# Patient Record
Sex: Female | Born: 1985 | Race: White | Hispanic: No | Marital: Single | Smoking: Never smoker
Health system: Southern US, Community
[De-identification: ages and names within clinical notes are randomized; demographics above are authoritative.]

## PROBLEM LIST (undated history)

## (undated) DIAGNOSIS — G709 Myoneural disorder, unspecified: Secondary | ICD-10-CM

## (undated) DIAGNOSIS — F419 Anxiety disorder, unspecified: Secondary | ICD-10-CM

## (undated) DIAGNOSIS — R519 Headache, unspecified: Secondary | ICD-10-CM

## (undated) DIAGNOSIS — F32A Depression, unspecified: Secondary | ICD-10-CM

## (undated) DIAGNOSIS — F329 Major depressive disorder, single episode, unspecified: Secondary | ICD-10-CM

## (undated) DIAGNOSIS — F410 Panic disorder [episodic paroxysmal anxiety] without agoraphobia: Secondary | ICD-10-CM

## (undated) DIAGNOSIS — R87629 Unspecified abnormal cytological findings in specimens from vagina: Secondary | ICD-10-CM

## (undated) HISTORY — PX: OTHER SURGICAL HISTORY: SHX169

## (undated) HISTORY — DX: Anxiety disorder, unspecified: F41.9

## (undated) HISTORY — PX: ABDOMINAL SURGERY: SHX537

## (undated) HISTORY — PX: FACIAL FRACTURE SURGERY: SHX1570

---
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
## 2010-04-09 ENCOUNTER — Emergency Department: Payer: Self-pay | Admitting: Emergency Medicine

## 2010-04-11 ENCOUNTER — Emergency Department: Payer: Self-pay | Admitting: Unknown Physician Specialty

## 2010-05-07 ENCOUNTER — Emergency Department: Payer: Self-pay | Admitting: Internal Medicine

## 2010-08-16 ENCOUNTER — Emergency Department: Payer: Self-pay | Admitting: Emergency Medicine

## 2010-08-17 ENCOUNTER — Emergency Department: Payer: Self-pay | Admitting: *Deleted

## 2010-08-18 ENCOUNTER — Emergency Department: Payer: Self-pay | Admitting: *Deleted

## 2010-08-21 ENCOUNTER — Emergency Department: Payer: Self-pay | Admitting: Internal Medicine

## 2010-08-22 ENCOUNTER — Emergency Department: Payer: Self-pay | Admitting: Emergency Medicine

## 2010-08-25 ENCOUNTER — Emergency Department: Payer: Self-pay | Admitting: Emergency Medicine

## 2010-08-28 ENCOUNTER — Ambulatory Visit: Payer: Self-pay | Admitting: Anesthesiology

## 2010-10-22 ENCOUNTER — Emergency Department: Payer: Self-pay | Admitting: Emergency Medicine

## 2011-03-04 ENCOUNTER — Emergency Department: Payer: Self-pay | Admitting: Unknown Physician Specialty

## 2011-03-08 ENCOUNTER — Emergency Department: Payer: Self-pay | Admitting: Emergency Medicine

## 2011-03-08 LAB — URINALYSIS, COMPLETE
Bilirubin,UR: NEGATIVE
Ph: 5 (ref 4.5–8.0)
Protein: 100
RBC,UR: 135 /HPF (ref 0–5)
Specific Gravity: 1.032 (ref 1.003–1.030)
Squamous Epithelial: 6
WBC UR: 25 /HPF (ref 0–5)

## 2011-03-08 LAB — COMPREHENSIVE METABOLIC PANEL
Albumin: 3.5 g/dL (ref 3.4–5.0)
Alkaline Phosphatase: 154 U/L — ABNORMAL HIGH (ref 50–136)
Anion Gap: 16 (ref 7–16)
BUN: 15 mg/dL (ref 7–18)
Bilirubin,Total: 0.9 mg/dL (ref 0.2–1.0)
Chloride: 99 mmol/L (ref 98–107)
Co2: 23 mmol/L (ref 21–32)
Creatinine: 0.72 mg/dL (ref 0.60–1.30)
EGFR (Non-African Amer.): >60
Glucose: 107 mg/dL — ABNORMAL HIGH (ref 65–99)
Osmolality: 277 (ref 275–301)
Potassium: 3.2 mmol/L — ABNORMAL LOW (ref 3.5–5.1)
SGOT(AST): 26 U/L (ref 15–37)

## 2011-03-08 LAB — CBC
HCT: 41.5 % (ref 35.0–47.0)
MCH: 31.7 pg (ref 26.0–34.0)
MCV: 93 fL (ref 80–100)
Platelet: 208 10*3/uL (ref 150–440)
RBC: 4.48 10*6/uL (ref 3.80–5.20)
RDW: 13.6 % (ref 11.5–14.5)
WBC: 13.4 10*3/uL — ABNORMAL HIGH (ref 3.6–11.0)

## 2011-03-08 LAB — LIPASE, BLOOD: Lipase: 70 U/L — ABNORMAL LOW (ref 73–393)

## 2011-03-08 LAB — PREGNANCY, URINE: Pregnancy Test, Urine: NEGATIVE m[IU]/mL

## 2012-01-02 HISTORY — PX: BREAST ENHANCEMENT SURGERY: SHX7

## 2014-05-19 ENCOUNTER — Other Ambulatory Visit (HOSPITAL_COMMUNITY)
Admission: RE | Admit: 2014-05-19 | Discharge: 2014-05-19 | Disposition: A | Discharge status: Home / Self Care | Payer: Self-pay | Source: Ambulatory Visit | Attending: Family Medicine | Admitting: Family Medicine

## 2014-05-19 ENCOUNTER — Other Ambulatory Visit: Payer: Self-pay | Admitting: Physician Assistant

## 2014-05-19 DIAGNOSIS — Z124 Encounter for screening for malignant neoplasm of cervix: Secondary | ICD-10-CM | POA: Insufficient documentation

## 2014-05-24 LAB — CYTOLOGY - PAP

## 2015-01-02 NOTE — L&D Delivery Note (Signed)
Delivery Note  At 3:15 PM a viable female, named Debra Palmer,  was delivered via Vaginal, Spontaneous Delivery  APGAR: 9, 9; weight 6 lb 12.3 oz (3070 g).   Placenta status: spontaneously expelled, complete with 3 Vx cord  Anesthesia:  epidural Episiotomy: None Lacerations: Periurethral Suture Repair: 4-0 Monocryl Est. Blood Loss (mL): 200  Mom to postpartum.  Baby to Couplet care / Skin to Skin  Bottlefeeding. Outpatient circumcision.  Oryn Casanova A 11/03/2015, 6:56 PM

## 2015-04-14 ENCOUNTER — Emergency Department (HOSPITAL_COMMUNITY)
Admission: EM | Admit: 2015-04-14 | Discharge: 2015-04-14 | Disposition: A | Discharge status: Home / Self Care | Payer: Medicaid Other | Attending: Emergency Medicine | Admitting: Emergency Medicine

## 2015-04-14 ENCOUNTER — Encounter (HOSPITAL_COMMUNITY): Payer: Self-pay | Admitting: *Deleted

## 2015-04-14 DIAGNOSIS — R451 Restlessness and agitation: Secondary | ICD-10-CM | POA: Diagnosis not present

## 2015-04-14 DIAGNOSIS — F410 Panic disorder [episodic paroxysmal anxiety] without agoraphobia: Secondary | ICD-10-CM | POA: Insufficient documentation

## 2015-04-14 DIAGNOSIS — F439 Reaction to severe stress, unspecified: Secondary | ICD-10-CM | POA: Insufficient documentation

## 2015-04-14 DIAGNOSIS — F329 Major depressive disorder, single episode, unspecified: Secondary | ICD-10-CM | POA: Diagnosis not present

## 2015-04-14 DIAGNOSIS — X788XXA Intentional self-harm by other sharp object, initial encounter: Secondary | ICD-10-CM | POA: Insufficient documentation

## 2015-04-14 DIAGNOSIS — Y998 Other external cause status: Secondary | ICD-10-CM | POA: Insufficient documentation

## 2015-04-14 DIAGNOSIS — S70311A Abrasion, right thigh, initial encounter: Secondary | ICD-10-CM | POA: Insufficient documentation

## 2015-04-14 DIAGNOSIS — Y9289 Other specified places as the place of occurrence of the external cause: Secondary | ICD-10-CM | POA: Insufficient documentation

## 2015-04-14 DIAGNOSIS — F32A Depression, unspecified: Secondary | ICD-10-CM

## 2015-04-14 DIAGNOSIS — Z046 Encounter for general psychiatric examination, requested by authority: Secondary | ICD-10-CM | POA: Diagnosis present

## 2015-04-14 DIAGNOSIS — Y9389 Activity, other specified: Secondary | ICD-10-CM | POA: Insufficient documentation

## 2015-04-14 HISTORY — DX: Panic disorder (episodic paroxysmal anxiety): F41.0

## 2015-04-14 NOTE — ED Notes (Signed)
Pt brought in by police voluntarily Pt states she has been scratching and and cutting herself today due to anxiety. Pt states cutting and scratching relieves her anxiety. Pt carved the word "hell" on her hip this morning. Pt denies suicidal thoughts and states she is not trying to kill herself. Pt states she has cut or scratched herself since she was a teenager. Pt states she not currently taking medication for anxiety/depression.
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
## 2015-04-14 NOTE — Discharge Instructions (Signed)
Please follow up with resources provided. Call Crisis line if needed.   Substance Abuse Treatment Programs  Intensive Outpatient Programs [facility]     [address]      [place], [place]                   [phone]       Debra Ringer [facility] [address] [place], [place] [phone]  [facility]     (Inpatient and outpatient)     [address]           [phone]    [facility] [phone] (Suboxone and Methadone)  [address]      [place], [place] [address]      [phone]       [address] [place], [place] [phone]  [facility]bra [facility] (Outpatient/Inpatient, Chemical)    (insurance only) [phone]             [facility] (Groups & Residential) [place], [place] [phone]     [facility]     [address]     [place], [place]      [phone]       [facility] (for caregivers and family) [phone] [address] [place], [place] [phone]      Residential Treatment Programs [facility]      [address], [place], [place] [address]  [phone]       Debra Palmer [address] [place], [place] [address] [phone]  Debra Palmer        [phone]       [facility] [phone]  [facility] ([facility]             [address]                                         [place], [place]                                                [phone] or [phone]                               Debra Palmer [address] [place], [address] [phone]  [facility]    [address]      [place], [place]     [phone]       [facility]ra [facility] [address] [place], [place] [phone]  [facility]   [address]     [place], [place] [address]     [phone]      Admissions: 8am-3pm  M-F  [facility] ([facility]) [address] [place], [place] [phone]  [facility] Program: Residential Program [phone](90 Days)   [place], [place]      [phone] or [phone]     [facility]C: [facility] [place], [place] (Walk in Hours over Debra weekend or by referral)  [facility] [address] [place], [place], [place] [address] [phone]  Crisis Mobile: [facility]:  [phone] (for crisis response 24 hours a day) [facility] Hotline:  [phone] Outpatient Psychiatry and Counseling  [facility]: Mobile Crisis Management 24 hours:  [phone]  [facility] Debra [employer] sliding scale fee and walk in schedule: M-F 8am-12pm/1pm-3pm [address]  [place], [place] [address] [phone]  [facility] [address] [place], [place] [address] [phone]  [facility] (Formerly known as Debra [employer])- new patient walk-in appointments available Monday - Friday 8am -3pm.          [address] [place], [place] [address] [phone] or crisis line- [phone]  [facility]/ Intensive Outpatient Therapy Program [address] [place], [place] [address] [phone]  [facility]                  Crisis Services      [phone] [address]     [place], [place] [address]                 [facility]   [facility] [phone]. [address] [place], [place] [address]   [employer] of Care          [address]  [place], [place] [address]       [phone]  [facility] [address] [place], [place] [address] [phone]  [facility]    [address]    [place], [place] [address]     [phone]       Andee Poles, MD     [address]     [place]  [address]     [phone]       [facility] [address] [place] [address]  [facility]     [address] [place], [place]      [phone]       [facility] Eulogio Ditch, MD [address] [place], [place] [address] [phone]  [facility]     [address]     [place], [place] [address]     [phone]       [facility] [address] [place], [place] [address] [phone] Resources for Temporary Residential Assistance/Crisis Centers  DAY CENTERS [facility]) M-F 8am-3pm   [address] [place], [place] [address]   [phone] Services include: laundry, barbering, support groups, case management, phone  & computer access, showers, AA/NA mtgs, mental health/substance abuse nurse, job skills class, disability information, VA assistance, spiritual classes, etc.   HOMELESS SHELTERS  [facility]     [facility]   [address], [place]     [phone]              [employer] (women and children)       [address] [place], [place] [address] [phone] [email or web address] for application and process Application Required  [facility] [employer] [facility]   [address]    [place] [address]     [phone]                    [facility] [address] [place], [place] [address] [phone] application appt.) Application Required  [employer] (women only)    [address]     [place], [place]  [place]      Intake starts 6pm daily Need valid ID, SSC, & Police report [employer] [address] [place], [place] [phone] Application Required  [employer] (men only)     [employer]      [place], [place]       [place] (Pregnant  women only) [address] [place], [place]  Debra [facility]      [address]      [place], [place] [address]     [phone]             [facility] [address] [place], [place] 90 day commitment/SA/Application process  [facility](men only)     [address]     [place], [place]       Check-in at [facility] [address] [place], [place] [address] [phone] Men/Women/Women and Children must be there by 7 pm  [place], [place]

## 2015-05-19 LAB — OB RESULTS CONSOLE HEPATITIS B SURFACE ANTIGEN: HEP B S AG: NEGATIVE

## 2015-05-19 LAB — OB RESULTS CONSOLE RPR
RPR: NONREACTIVE
RPR: NONREACTIVE

## 2015-05-19 LAB — OB RESULTS CONSOLE HIV ANTIBODY (ROUTINE TESTING): HIV: NONREACTIVE

## 2015-05-19 LAB — OB RESULTS CONSOLE GC/CHLAMYDIA
Chlamydia: NEGATIVE
Gonorrhea: NEGATIVE

## 2015-05-19 LAB — OB RESULTS CONSOLE RUBELLA ANTIBODY, IGM: RUBELLA: IMMUNE

## 2015-08-10 ENCOUNTER — Emergency Department (HOSPITAL_COMMUNITY)
Admission: EM | Admit: 2015-08-10 | Discharge: 2015-08-10 | Disposition: A | Discharge status: Home / Self Care | Payer: Medicaid Other | Attending: Emergency Medicine | Admitting: Emergency Medicine

## 2015-08-10 ENCOUNTER — Encounter (HOSPITAL_COMMUNITY): Payer: Self-pay | Admitting: Emergency Medicine

## 2015-08-10 DIAGNOSIS — Z3A26 26 weeks gestation of pregnancy: Secondary | ICD-10-CM | POA: Insufficient documentation

## 2015-08-10 DIAGNOSIS — O26892 Other specified pregnancy related conditions, second trimester: Secondary | ICD-10-CM | POA: Insufficient documentation

## 2015-08-10 DIAGNOSIS — R1084 Generalized abdominal pain: Secondary | ICD-10-CM | POA: Diagnosis not present

## 2015-08-10 LAB — COMPREHENSIVE METABOLIC PANEL
ALBUMIN: 2.9 g/dL — AB (ref 3.5–5.0)
ALT: 17 U/L (ref 14–54)
AST: 18 U/L (ref 15–41)
Alkaline Phosphatase: 69 U/L (ref 38–126)
Anion gap: 7 (ref 5–15)
BUN: 8 mg/dL (ref 6–20)
CHLORIDE: 104 mmol/L (ref 101–111)
CO2: 24 mmol/L (ref 22–32)
Calcium: 9.2 mg/dL (ref 8.9–10.3)
Creatinine, Ser: 0.66 mg/dL (ref 0.44–1.00)
GFR calc Af Amer: >60 mL/min (ref >60)
GFR calc non Af Amer: >60 mL/min (ref >60)
GLUCOSE: 91 mg/dL (ref 65–99)
POTASSIUM: 3.6 mmol/L (ref 3.5–5.1)
Sodium: 135 mmol/L (ref 135–145)
Total Bilirubin: 0.5 mg/dL (ref 0.3–1.2)
Total Protein: 6.6 g/dL (ref 6.5–8.1)

## 2015-08-10 LAB — CBC
HEMATOCRIT: 39 % (ref 36.0–46.0)
Hemoglobin: 13 g/dL (ref 12.0–15.0)
MCH: 31.5 pg (ref 26.0–34.0)
MCHC: 33.3 g/dL (ref 30.0–36.0)
MCV: 94.4 fL (ref 78.0–100.0)
Platelets: 205 10*3/uL (ref 150–400)
RBC: 4.13 MIL/uL (ref 3.87–5.11)
RDW: 13.1 % (ref 11.5–15.5)
WBC: 10.1 10*3/uL (ref 4.0–10.5)

## 2015-08-10 LAB — URINALYSIS, ROUTINE W REFLEX MICROSCOPIC
BILIRUBIN URINE: NEGATIVE
GLUCOSE, UA: NEGATIVE mg/dL
HGB URINE DIPSTICK: NEGATIVE
KETONES UR: NEGATIVE mg/dL
Nitrite: NEGATIVE
PH: 6.5 (ref 5.0–8.0)
Protein, ur: NEGATIVE mg/dL
Specific Gravity, Urine: 1.024 (ref 1.005–1.030)

## 2015-08-10 LAB — LIPASE, BLOOD: LIPASE: 21 U/L (ref 11–51)

## 2015-08-10 LAB — URINE MICROSCOPIC-ADD ON

## 2015-08-10 MED ORDER — SODIUM CHLORIDE 0.9 % IV BOLUS (SEPSIS)
1000.0000 mL | Freq: Once | INTRAVENOUS | Status: AC
  Administered 2015-08-10: 1000 mL via INTRAVENOUS

## 2015-08-10 NOTE — ED Triage Notes (Signed)
Pt. reports intermittent generalized abdominal cramping with mild nausea onset last night , she is [redacted] weeks pregnant G2P1 , denies vaginal bleeding or discharge . No fever or chills. Notified rapid response OB nurse and requested to call her back /notify her when pt. has a room .

## 2015-08-10 NOTE — Discharge Instructions (Signed)
You have been seen today for abdominal pain. Your lab tests and OB assessment showed no unexpected abnormalities. Follow up with OB/GYN as soon as possible. Return to ED as needed.

## 2015-08-10 NOTE — ED Provider Notes (Signed)
[facility]EM[facility] DEPT Provider Note   CSN: [id number] Arrival date & time: 08/10/15  2003  First Provider Contact:  First MD Initiated Contact with Patient 08/10/15 2135        History   Chief Complaint Chief Complaint  Patient presents with  . Abdominal Pain    HPI Debra Palmer is a 30 y.o. female.  HPI   Debra Palmer is a 30 y.o. female, with a history of panic attacks, presenting to the ED with generalized abdominal pain that began gradually last night. Pain is constant, equally across the abdomen, cramping, moderate to severe, nonradiating. Pain was there for a few hours last night, and subsided before she went to sleep. Pt then had pain again today at around 4:30 pm, which lasted for about three hours, then subsided. Pt is currently pain-free and has been during her time in the ED. Endorses some minor constipation, but had a normal BM today.  Denies fever/chills, vomiting/diarrhea, urinary complaints, abnormal vaginal discharge or bleeding, or any other complaints.  Pt is [redacted] weeks pregnant, G2P1. No expected complications.  [facility]. Has been receiving regular prenatal care. Last saw Osborn Cohongela Roberts on July 24 and everything was normal.   Past Medical History:  Diagnosis Date  . Panic attack     There are no active problems to display for this patient.   Past Surgical History:  Procedure Laterality Date  . ABDOMINAL SURGERY      OB History    Gravida Para Term Preterm AB Living   2 1           SAB TAB Ectopic Multiple Live Births                   Home Medications    Prior to Admission medications   Not on File    Family History No family history on file.  Social History Social History  Substance Use Topics  . Smoking status: Never Smoker  . Smokeless tobacco: Not on file  . Alcohol use No     Allergies   Review of patient's allergies indicates no known allergies.   Review of Systems Review of Systems    Constitutional: Negative for chills, diaphoresis and fever.  Gastrointestinal: Positive for abdominal pain (resolved) and constipation (minor, occasional). Negative for diarrhea, nausea and vomiting.  Genitourinary: Negative for dysuria, flank pain, hematuria, pelvic pain and vaginal pain.  Neurological: Negative for dizziness and light-headedness.     Physical Exam Updated Vital Signs BP 98/67   Pulse 72   Temp 98.7 F (37.1 C) (Oral)   Resp 15   Ht 5\' 2"  (1.575 m)   Wt 61.2 kg   LMP 02/19/2015   SpO2 100%   BMI 24.69 kg/m   Physical Exam  Constitutional: She appears well-developed and well-nourished. No distress.  HENT:  Head: Normocephalic and atraumatic.  Eyes: Conjunctivae are normal.  Neck: Neck supple.  Cardiovascular: Normal rate, regular rhythm, normal heart sounds and intact distal pulses.   Pulmonary/Chest: Effort normal and breath sounds normal. No respiratory distress.  Abdominal: Soft. There is no tenderness. There is no guarding.  Musculoskeletal: She exhibits no edema or tenderness.  Lymphadenopathy:    She has no cervical adenopathy.  Neurological: She is alert.  Skin: Skin is warm and dry. She is not diaphoretic.  Psychiatric: She has a normal mood and affect. Her behavior is normal.  Nursing note and vitals reviewed.    ED Treatments / Results  Labs (  all labs ordered are listed, but only abnormal results are displayed) Labs Reviewed  COMPREHENSIVE METABOLIC PANEL - Abnormal; Notable for the following:       Result Value   Albumin 2.9 (*)    All other components within normal limits  URINALYSIS, ROUTINE W REFLEX MICROSCOPIC (NOT AT [facility]) - Abnormal; Notable for the following:    Leukocytes, UA SMALL (*)    All other components within normal limits  URINE MICROSCOPIC-ADD ON - Abnormal; Notable for the following:    Squamous Epithelial / LPF 0-5 (*)    Bacteria, UA FEW (*)    All other components within normal limits  LIPASE, BLOOD  CBC     EKG  EKG Interpretation None       Radiology No results found.  Procedures Procedures (including critical care time)  Medications Ordered in ED Medications  sodium chloride 0.9 % bolus 1,000 mL (0 mLs Intravenous Stopped 08/10/15 2306)     Initial Impression / Assessment and Plan / ED Course  I have reviewed the triage vital signs and the nursing notes.  Pertinent labs & imaging results that were available during my care of the patient were reviewed by me and considered in my medical decision making (see chart for details).  Clinical Course    Debra Palmer presents with intermittent abdominal pain beginning last night.  Findings and plan of care discussed with Sam Jacubowitz. Dr. Ethelda Chick personally evaluated and examined this patient.  Patient is nontoxic appearing, afebrile, not tachycardic, not tachypneic, not hypotensive, maintains SPO2 of 98-100% on room air, and is in no apparent distress. Patient has no signs of sepsis or other serious or life-threatening condition. Rapid OB RN, Olegario Messier, came to assess the patient. Found the patient to be having intermittent contractions. Olegario Messier consulted with Dr. Richardson Dopp, OB/GYN, who advised that if the UA is clear, patient should get a liter of fluids and then be reassessed. Patient did not have any contractions for over half an hour, once fluids were started. Patient continued to be completely free. OB nurse reassessed patient and cleared patient for discharge. Patient to follow up with her OB/GYN this week. Patient voices understanding of these instructions, accepts the plan, and is comfortable with discharge.  Vitals:   08/10/15 2008 08/10/15 2100 08/10/15 2115 08/10/15 2130  BP: 102/62 97/64 100/65 98/67  Pulse: 91 74 78 72  Resp: Temp: 98.7 F (37.1 C)     TempSrc: Oral     SpO2: 98% 99% 98% 100%  Weight: 61.2 kg     Height:  (1.575 m)      Vitals:   08/10/15 2130 08/10/15 2200 08/10/15 2215  08/10/15 2245  BP: (!) 86/61  Pulse: 72 79 76 75  Resp: Temp:      TempSrc:      SpO2: 100% 99% 100% 100%  Weight:      Height:         Final Clinical Impressions(s) / ED Diagnoses   Final diagnoses:  Generalized abdominal pain    New Prescriptions There are no discharge medications for this patient.    Anselm Pancoast, PA-C 08/10/15 2344    Doug Sou, MD 08/11/15 [phone]

## 2015-08-10 NOTE — ED Provider Notes (Signed)
Patient with vague diffuse abdominal pain which started last night and continued on today. She is presently asymptomatic since here. She's undergone fetal monitoring and deemed to be safe for discharge to home.   Doug SouSam Damarious Holtsclaw, MD 08/10/15 [phone]

## 2015-10-23 ENCOUNTER — Inpatient Hospital Stay (HOSPITAL_COMMUNITY)
Admission: AD | Admit: 2015-10-23 | Discharge: 2015-10-23 | Disposition: A | Discharge status: Home / Self Care | Payer: Medicaid Other | Source: Ambulatory Visit | Attending: Obstetrics and Gynecology | Admitting: Obstetrics and Gynecology

## 2015-10-23 ENCOUNTER — Encounter (HOSPITAL_COMMUNITY): Payer: Self-pay

## 2015-10-23 DIAGNOSIS — Z3A37 37 weeks gestation of pregnancy: Secondary | ICD-10-CM | POA: Diagnosis not present

## 2015-10-23 DIAGNOSIS — Z3493 Encounter for supervision of normal pregnancy, unspecified, third trimester: Secondary | ICD-10-CM

## 2015-10-23 DIAGNOSIS — O26893 Other specified pregnancy related conditions, third trimester: Secondary | ICD-10-CM | POA: Diagnosis not present

## 2015-10-23 DIAGNOSIS — R102 Pelvic and perineal pain: Secondary | ICD-10-CM | POA: Insufficient documentation

## 2015-10-23 DIAGNOSIS — O9989 Other specified diseases and conditions complicating pregnancy, childbirth and the puerperium: Secondary | ICD-10-CM

## 2015-10-23 DIAGNOSIS — Z3689 Encounter for other specified antenatal screening: Secondary | ICD-10-CM

## 2015-10-23 LAB — URINALYSIS, ROUTINE W REFLEX MICROSCOPIC
BILIRUBIN URINE: NEGATIVE
Glucose, UA: NEGATIVE mg/dL
Hgb urine dipstick: NEGATIVE
Ketones, ur: NEGATIVE mg/dL
NITRITE: NEGATIVE
PROTEIN: NEGATIVE mg/dL
SPECIFIC GRAVITY, URINE: 1.025 (ref 1.005–1.030)
pH: 6 (ref 5.0–8.0)

## 2015-10-23 LAB — URINE MICROSCOPIC-ADD ON: RBC / HPF: NONE SEEN RBC/hpf (ref 0–5)

## 2015-10-23 NOTE — Discharge Instructions (Signed)
Braxton Hicks Contractions °Contractions of the uterus can occur throughout pregnancy. Contractions are not always a sign that you are in labor.  °WHAT ARE BRAXTON HICKS CONTRACTIONS?  °Contractions that occur before labor are called Braxton Hicks contractions, or false labor. Toward the end of pregnancy (32-34 weeks), these contractions can develop more often and may become more forceful. This is not true labor because these contractions do not result in opening (dilatation) and thinning of the cervix. They are sometimes difficult to tell apart from true labor because these contractions can be forceful and people have different pain tolerances. You should not feel embarrassed if you go to the hospital with false labor. Sometimes, the only way to tell if you are in true labor is for your health care provider to look for changes in the cervix. °If there are no prenatal problems or other health problems associated with the pregnancy, it is completely safe to be sent home with false labor and await the onset of true labor. °HOW CAN YOU TELL THE DIFFERENCE BETWEEN TRUE AND FALSE LABOR? °False Labor °· The contractions of false labor are usually shorter and not as hard as those of true labor.   °· The contractions are usually irregular.   °· The contractions are often felt in the front of the lower abdomen and in the groin.   °· The contractions may go away when you walk around or change positions while lying down.   °· The contractions get weaker and are shorter lasting as time goes on.   °· The contractions do not usually become progressively stronger, regular, and closer together as with true labor.   °True Labor °· Contractions in true labor last 30-70 seconds, become very regular, usually become more intense, and increase in frequency.   °· The contractions do not go away with walking.   °· The discomfort is usually felt in the top of the uterus and spreads to the lower abdomen and low back.   °· True labor can be  determined by your health care provider with an exam. This will show that the cervix is dilating and getting thinner.   °WHAT TO REMEMBER °· Keep up with your usual exercises and follow other instructions given by your health care provider.   °· Take medicines as directed by your health care provider.   °· Keep your regular prenatal appointments.   °· Eat and drink lightly if you think you are going into labor.   °· If Braxton Hicks contractions are making you uncomfortable:   °¨ Change your position from lying down or resting to walking, or from walking to resting.   °¨ Sit and rest in a tub of warm water.   °¨ Drink 2-3 glasses of water. Dehydration may cause these contractions.   °¨ Do slow and deep breathing several times an hour.   °WHEN SHOULD I SEEK IMMEDIATE MEDICAL CARE? °Seek immediate medical care if: °· Your contractions become stronger, more regular, and closer together.   °· You have fluid leaking or gushing from your vagina.   °· You have a fever.   °· You pass blood-tinged mucus.   °· You have vaginal bleeding.   °· You have continuous abdominal pain.   °· You have low back pain that you never had before.   °· You feel your baby's head pushing down and causing pelvic pressure.   °· Your baby is not moving as much as it used to.   °  °This information is not intended to replace advice given to you by your health care provider. Make sure you discuss any questions you have with your health care   provider. °  °Document Released: 12/18/2004 Document Revised: 12/23/2012 Document Reviewed: 09/29/2012 °Elsevier Interactive Patient Education ©2016 [employer] °Fetal Movement Counts °Patient Name: __________________________________________________ Patient Due Date: ____________________ °Performing a fetal movement count is highly recommended in high-risk pregnancies, but it is good for every pregnant woman to do. Your health care provider may ask you to start counting fetal movements at 28 weeks of the  pregnancy. Fetal movements often increase: °· After eating a full meal. °· After physical activity. °· After eating or drinking something sweet or cold. °· At rest. °Pay attention to when you feel the baby is most active. This will help you notice a pattern of your baby's sleep and wake cycles and what factors contribute to an increase in fetal movement. It is important to perform a fetal movement count at the same time each day when your baby is normally most active.  °HOW TO COUNT FETAL MOVEMENTS °1. Find a quiet and comfortable area to sit or lie down on your left side. Lying on your left side provides the best blood and oxygen circulation to your baby. °2. Write down the day and time on a sheet of paper or in a journal. °3. Start counting kicks, flutters, swishes, rolls, or jabs in a 2-hour period. You should feel at least 10 movements within 2 hours. °4. If you do not feel 10 movements in 2 hours, wait 2-3 hours and count again. Look for a change in the pattern or not enough counts in 2 hours. °SEEK MEDICAL CARE IF: °· You feel less than 10 counts in 2 hours, tried twice. °· There is no movement in over an hour. °· The pattern is changing or taking longer each day to reach 10 counts in 2 hours. °· You feel the baby is not moving as he or she usually does. °Date: ____________ Movements: ____________ Start time: ____________ Finish time: ____________  °Date: ____________ Movements: ____________ Start time: ____________ Finish time: ____________ °Date: ____________ Movements: ____________ Start time: ____________ Finish time: ____________ °Date: ____________ Movements: ____________ Start time: ____________ Finish time: ____________ °Date: ____________ Movements: ____________ Start time: ____________ Finish time: ____________ °Date: ____________ Movements: ____________ Start time: ____________ Finish time: ____________ °Date: ____________ Movements: ____________ Start time: ____________ Finish time:  ____________ °Date: ____________ Movements: ____________ Start time: ____________ Finish time: ____________  °Date: ____________ Movements: ____________ Start time: ____________ Finish time: ____________ °Date: ____________ Movements: ____________ Start time: ____________ Finish time: ____________ °Date: ____________ Movements: ____________ Start time: ____________ Finish time: ____________ °Date: ____________ Movements: ____________ Start time: ____________ Finish time: ____________ °Date: ____________ Movements: ____________ Start time: ____________ Finish time: ____________ °Date: ____________ Movements: ____________ Start time: ____________ Finish time: ____________ °Date: ____________ Movements: ____________ Start time: ____________ Finish time: ____________  °Date: ____________ Movements: ____________ Start time: ____________ Finish time: ____________ °Date: ____________ Movements: ____________ Start time: ____________ Finish time: ____________ °Date: ____________ Movements: ____________ Start time: ____________ Finish time: ____________ °Date: ____________ Movements: ____________ Start time: ____________ Finish time: ____________ °Date: ____________ Movements: ____________ Start time: ____________ Finish time: ____________ °Date: ____________ Movements: ____________ Start time: ____________ Finish time: ____________ °Date: ____________ Movements: ____________ Start time: ____________ Finish time: ____________  °Date: ____________ Movements: ____________ Start time: ____________ Finish time: ____________ °Date: ____________ Movements: ____________ Start time: ____________ Finish time: ____________ °Date: ____________ Movements: ____________ Start time: ____________ Finish time: ____________ °Date: ____________ Movements: ____________ Start time: ____________ Finish time: ____________ °Date: ____________ Movements: ____________ Start time: ____________ Finish time: ____________ °Date: ____________ Movements:  ____________ Start time: ____________ Finish time:   ____________ °Date: ____________ Movements: ____________ Start time: ____________ Finish time: ____________  °Date: ____________ Movements: ____________ Start time: ____________ Finish time: ____________ °Date: ____________ Movements: ____________ Start time: ____________ Finish time: ____________ °Date: ____________ Movements: ____________ Start time: ____________ Finish time: ____________ °Date: ____________ Movements: ____________ Start time: ____________ Finish time: ____________ °Date: ____________ Movements: ____________ Start time: ____________ Finish time: ____________ °Date: ____________ Movements: ____________ Start time: ____________ Finish time: ____________ °Date: ____________ Movements: ____________ Start time: ____________ Finish time: ____________  °Date: ____________ Movements: ____________ Start time: ____________ Finish time: ____________ °Date: ____________ Movements: ____________ Start time: ____________ Finish time: ____________ °Date: ____________ Movements: ____________ Start time: ____________ Finish time: ____________ °Date: ____________ Movements: ____________ Start time: ____________ Finish time: ____________ °Date: ____________ Movements: ____________ Start time: ____________ Finish time: ____________ °Date: ____________ Movements: ____________ Start time: ____________ Finish time: ____________ °Date: ____________ Movements: ____________ Start time: ____________ Finish time: ____________  °Date: ____________ Movements: ____________ Start time: ____________ Finish time: ____________ °Date: ____________ Movements: ____________ Start time: ____________ Finish time: ____________ °Date: ____________ Movements: ____________ Start time: ____________ Finish time: ____________ °Date: ____________ Movements: ____________ Start time: ____________ Finish time: ____________ °Date: ____________ Movements: ____________ Start time: ____________ Finish  time: ____________ °Date: ____________ Movements: ____________ Start time: ____________ Finish time: ____________ °Date: ____________ Movements: ____________ Start time: ____________ Finish time: ____________  °Date: ____________ Movements: ____________ Start time: ____________ Finish time: ____________ °Date: ____________ Movements: ____________ Start time: ____________ Finish time: ____________ °Date: ____________ Movements: ____________ Start time: ____________ Finish time: ____________ °Date: ____________ Movements: ____________ Start time: ____________ Finish time: ____________ °Date: ____________ Movements: ____________ Start time: ____________ Finish time: ____________ °Date: ____________ Movements: ____________ Start time: ____________ Finish time: ____________ °  °This information is not intended to replace advice given to you by your health care provider. Make sure you discuss any questions you have with your health care provider. °  °Document Released: 01/17/2006 Document Revised: 01/08/2014 Document Reviewed: 10/15/2011 °Elsevier Interactive Patient Education ©2016 [employer] Inc[employer] ° °

## 2015-10-23 NOTE — MAU Provider Note (Signed)
History     CSN: [id number]  Arrival date and time: 10/23/15 4098   None     Chief Complaint  Patient presents with  . Vaginal Pain   G3P1011 @37 .3 weeks here with acute onset of severe constant vaginal pressure when she got up to BR at 0100. Pain occurs with ambulation. She reports good FM. She denies VB, LOF, and reg painful ctx. No urinary sx. No vaginal discharge or odor. No constipation, BM this am.    OB History    Gravida Para Term Preterm AB Living   3 1     1 1    SAB TAB Ectopic Multiple Live Births   1       1      Past Medical History:  Diagnosis Date  . Panic attack     Past Surgical History:  Procedure Laterality Date  . ABDOMINAL SURGERY      History reviewed. No pertinent family history.  Social History  Substance Use Topics  . Smoking status: Never Smoker  . Smokeless tobacco: Never Used  . Alcohol use No    Allergies: No Known Allergies  No prescriptions prior to admission.    Review of Systems  Constitutional: Negative.   Genitourinary: Negative.    Physical Exam   Blood pressure 101/66, pulse 75, temperature 97.8 F (36.6 C), resp. rate 18, height 5\' 2"  (1.575 m), weight 64.9 kg (143 lb 1.9 oz), last menstrual period 02/19/2015.  Physical Exam  Constitutional: She is oriented to person, place, and time. She appears well-developed and well-nourished.  HENT:  Head: Normocephalic and atraumatic.  Neck: Normal range of motion. Neck supple.  Cardiovascular: Normal rate.   Respiratory: Effort normal.  GI: Soft. She exhibits no distension. There is no tenderness.  gravid  Genitourinary:  Genitourinary Comments: SVE: closed/thick  Musculoskeletal: Normal range of motion.  Neurological: She is alert and oriented to person, place, and time.  Skin: Skin is warm and dry.  Psychiatric: She has a normal mood and affect.   EFM: 120 bpm, mod variability, + accels, no decels Toco: irritability Results for orders placed or performed  during the hospital encounter of 10/23/15 (from the past 24 hour(s))  Urinalysis, Routine w reflex microscopic (not at [facility])     Status: Abnormal   Collection Time: 10/23/15  8:40 AM  Result Value Ref Range   Color, Urine YELLOW YELLOW   APPearance HAZY (A) CLEAR   Specific Gravity, Urine 1.025 1.005 - 1.030   pH 6.0 5.0 - 8.0   Glucose, UA NEGATIVE NEGATIVE mg/dL   Hgb urine dipstick NEGATIVE NEGATIVE   Bilirubin Urine NEGATIVE NEGATIVE   Ketones, ur NEGATIVE NEGATIVE mg/dL   Protein, ur NEGATIVE NEGATIVE mg/dL   Nitrite NEGATIVE NEGATIVE   Leukocytes, UA MODERATE (A) NEGATIVE  Urine microscopic-add on     Status: Abnormal   Collection Time: 10/23/15  8:40 AM  Result Value Ref Range   Squamous Epithelial / LPF 6-30 (A) NONE SEEN   WBC, UA 6-30 0 - 5 WBC/hpf   RBC / HPF NONE SEEN 0 - 5 RBC/hpf   Bacteria, UA RARE (A) NONE SEEN   Urine-Other MUCOUS PRESENT     MAU Course  Procedures  MDM Labs ordered and reviewed. No evidence of UTI or labor. Likely fetal engagement. Discussed presentation, clinical findings, and plan with Dr. Su Hilt. Stable for discharge home.  Assessment and Plan   1. Third trimester pregnancy   2. NST (non-stress test) reactive  3. Lightening of fetus during pregnancy in third trimester    Discharge home Labor precautions [facility] Comfort measures: maternity support belt, warm baths, position changes, Tylenol Follow up in office as scheduled this week  Donette LarryMelanie Sargun Rummell, CNM 10/23/2015, 8:56 AM

## 2015-10-23 NOTE — MAU Note (Addendum)
Patient presents with onset of vaginal pain since around 0100 am today. Constant when walking, hurts to turn over in the bed, states she feels a lot of pressure.  Denies vaginal bleeding, is having vaginal discharge without an odor.

## 2015-11-03 ENCOUNTER — Inpatient Hospital Stay (HOSPITAL_COMMUNITY)
Admission: AD | Admit: 2015-11-03 | Discharge: 2015-11-04 | DRG: 775 | Disposition: A | Discharge status: Home / Self Care | Payer: Medicaid Other | Source: Ambulatory Visit | Attending: Obstetrics and Gynecology | Admitting: Obstetrics and Gynecology

## 2015-11-03 ENCOUNTER — Inpatient Hospital Stay (HOSPITAL_COMMUNITY): Payer: Medicaid Other | Admitting: Anesthesiology

## 2015-11-03 ENCOUNTER — Encounter (HOSPITAL_COMMUNITY): Payer: Self-pay | Admitting: *Deleted

## 2015-11-03 DIAGNOSIS — Z3A39 39 weeks gestation of pregnancy: Secondary | ICD-10-CM

## 2015-11-03 DIAGNOSIS — Z3483 Encounter for supervision of other normal pregnancy, third trimester: Secondary | ICD-10-CM | POA: Diagnosis present

## 2015-11-03 LAB — RPR: RPR Ser Ql: NONREACTIVE

## 2015-11-03 LAB — CBC
HCT: 44 % (ref 36.0–46.0)
Hemoglobin: 15.7 g/dL — ABNORMAL HIGH (ref 12.0–15.0)
MCH: 33 pg (ref 26.0–34.0)
MCHC: 35.7 g/dL (ref 30.0–36.0)
MCV: 92.4 fL (ref 78.0–100.0)
PLATELETS: 141 10*3/uL — AB (ref 150–400)
RBC: 4.76 MIL/uL (ref 3.87–5.11)
RDW: 13.4 % (ref 11.5–15.5)
WBC: 12.6 10*3/uL — AB (ref 4.0–10.5)

## 2015-11-03 LAB — TYPE AND SCREEN
ABO/RH(D): A POS
ANTIBODY SCREEN: NEGATIVE

## 2015-11-03 LAB — OB RESULTS CONSOLE GBS: STREP GROUP B AG: NEGATIVE

## 2015-11-03 LAB — ABO/RH: ABO/RH(D): A POS

## 2015-11-03 MED ORDER — LACTATED RINGERS IV SOLN
500.0000 mL | Freq: Once | INTRAVENOUS | Status: AC
  Administered 2015-11-03: 500 mL via INTRAVENOUS

## 2015-11-03 MED ORDER — PHENYLEPHRINE 40 MCG/ML (10ML) SYRINGE FOR IV PUSH (FOR BLOOD PRESSURE SUPPORT)
80.0000 ug | PREFILLED_SYRINGE | INTRAVENOUS | Status: DC | PRN
  Filled 2015-11-03: qty 10
  Filled 2015-11-03: qty 5

## 2015-11-03 MED ORDER — EPHEDRINE 5 MG/ML INJ
10.0000 mg | INTRAVENOUS | Status: DC | PRN
  Filled 2015-11-03: qty 4

## 2015-11-03 MED ORDER — TETANUS-DIPHTH-ACELL PERTUSSIS 5-2.5-18.5 LF-MCG/0.5 IM SUSP: 0.5000 mL | Freq: Once | INTRAMUSCULAR | Status: DC

## 2015-11-03 MED ORDER — DIPHENHYDRAMINE HCL 25 MG PO CAPS: 25.0000 mg | ORAL_CAPSULE | Freq: Four times a day (QID) | ORAL | Status: DC | PRN

## 2015-11-03 MED ORDER — ACETAMINOPHEN 325 MG PO TABS: 650.0000 mg | ORAL_TABLET | ORAL | Status: DC | PRN

## 2015-11-03 MED ORDER — SOD CITRATE-CITRIC ACID 500-334 MG/5ML PO SOLN: 30.0000 mL | ORAL | Status: DC | PRN

## 2015-11-03 MED ORDER — ONDANSETRON HCL 4 MG PO TABS: 4.0000 mg | ORAL_TABLET | ORAL | Status: DC | PRN

## 2015-11-03 MED ORDER — ACETAMINOPHEN 325 MG PO TABS
650.0000 mg | ORAL_TABLET | ORAL | Status: DC | PRN
  Administered 2015-11-04: 650 mg via ORAL
  Filled 2015-11-03: qty 2

## 2015-11-03 MED ORDER — LACTATED RINGERS IV SOLN: 500.0000 mL | INTRAVENOUS | Status: DC | PRN

## 2015-11-03 MED ORDER — ZOLPIDEM TARTRATE 5 MG PO TABS: 5.0000 mg | ORAL_TABLET | Freq: Every evening | ORAL | Status: DC | PRN

## 2015-11-03 MED ORDER — OXYTOCIN 40 UNITS IN LACTATED RINGERS INFUSION - SIMPLE MED
2.5000 [IU]/h | INTRAVENOUS | Status: DC
  Administered 2015-11-03: 2.5 [IU]/h via INTRAVENOUS
  Filled 2015-11-03: qty 1000

## 2015-11-03 MED ORDER — FENTANYL 2.5 MCG/ML BUPIVACAINE 1/10 % EPIDURAL INFUSION (WH - ANES)
14.0000 mL/h | INTRAMUSCULAR | Status: DC | PRN
  Administered 2015-11-03: 14 mL/h via EPIDURAL
  Filled 2015-11-03: qty 125

## 2015-11-03 MED ORDER — OXYTOCIN BOLUS FROM INFUSION
500.0000 mL | Freq: Once | INTRAVENOUS | Status: AC
  Administered 2015-11-03: 500 mL via INTRAVENOUS

## 2015-11-03 MED ORDER — PHENYLEPHRINE 40 MCG/ML (10ML) SYRINGE FOR IV PUSH (FOR BLOOD PRESSURE SUPPORT)
80.0000 ug | PREFILLED_SYRINGE | INTRAVENOUS | Status: DC | PRN
  Filled 2015-11-03: qty 5

## 2015-11-03 MED ORDER — DIBUCAINE 1 % RE OINT: 1.0000 "application " | TOPICAL_OINTMENT | RECTAL | Status: DC | PRN

## 2015-11-03 MED ORDER — IBUPROFEN 600 MG PO TABS
600.0000 mg | ORAL_TABLET | Freq: Four times a day (QID) | ORAL | Status: DC
  Administered 2015-11-04 (×4): 600 mg via ORAL
  Filled 2015-11-03 (×5): qty 1

## 2015-11-03 MED ORDER — ONDANSETRON HCL 4 MG/2ML IJ SOLN: 4.0000 mg | Freq: Four times a day (QID) | INTRAMUSCULAR | Status: DC | PRN

## 2015-11-03 MED ORDER — ONDANSETRON HCL 4 MG/2ML IJ SOLN: 4.0000 mg | INTRAMUSCULAR | Status: DC | PRN

## 2015-11-03 MED ORDER — PRENATAL MULTIVITAMIN CH
1.0000 | ORAL_TABLET | Freq: Every day | ORAL | Status: DC
  Administered 2015-11-04: 1 via ORAL
  Filled 2015-11-03: qty 1

## 2015-11-03 MED ORDER — FERROUS SULFATE 325 (65 FE) MG PO TABS
325.0000 mg | ORAL_TABLET | Freq: Two times a day (BID) | ORAL | Status: DC
  Administered 2015-11-04 (×2): 325 mg via ORAL
  Filled 2015-11-03 (×2): qty 1

## 2015-11-03 MED ORDER — MEASLES, MUMPS & RUBELLA VAC ~~LOC~~ INJ: 0.5000 mL | INJECTION | Freq: Once | SUBCUTANEOUS | Status: DC

## 2015-11-03 MED ORDER — LACTATED RINGERS IV SOLN
INTRAVENOUS | Status: DC
  Administered 2015-11-03 (×2): via INTRAVENOUS

## 2015-11-03 MED ORDER — BENZOCAINE-MENTHOL 20-0.5 % EX AERO
1.0000 "application " | INHALATION_SPRAY | CUTANEOUS | Status: DC | PRN
  Administered 2015-11-03: 1 via TOPICAL
  Filled 2015-11-03: qty 56

## 2015-11-03 MED ORDER — OXYCODONE-ACETAMINOPHEN 5-325 MG PO TABS: 1.0000 | ORAL_TABLET | ORAL | Status: DC | PRN

## 2015-11-03 MED ORDER — WITCH HAZEL-GLYCERIN EX PADS: 1.0000 "application " | MEDICATED_PAD | CUTANEOUS | Status: DC | PRN

## 2015-11-03 MED ORDER — DIPHENHYDRAMINE HCL 50 MG/ML IJ SOLN: 12.5000 mg | INTRAMUSCULAR | Status: DC | PRN

## 2015-11-03 MED ORDER — LIDOCAINE HCL (PF) 1 % IJ SOLN
INTRAMUSCULAR | Status: DC | PRN
  Administered 2015-11-03 (×2): 7 mL via EPIDURAL

## 2015-11-03 MED ORDER — OXYCODONE-ACETAMINOPHEN 5-325 MG PO TABS: 2.0000 | ORAL_TABLET | ORAL | Status: DC | PRN

## 2015-11-03 MED ORDER — SIMETHICONE 80 MG PO CHEW: 80.0000 mg | CHEWABLE_TABLET | ORAL | Status: DC | PRN

## 2015-11-03 MED ORDER — FLEET ENEMA 7-19 GM/118ML RE ENEM: 1.0000 | ENEMA | RECTAL | Status: DC | PRN

## 2015-11-03 MED ORDER — LIDOCAINE HCL (PF) 1 % IJ SOLN
30.0000 mL | INTRAMUSCULAR | Status: DC | PRN
  Filled 2015-11-03: qty 30

## 2015-11-03 MED ORDER — SENNOSIDES-DOCUSATE SODIUM 8.6-50 MG PO TABS
2.0000 | ORAL_TABLET | ORAL | Status: DC
  Administered 2015-11-04 (×2): 2 via ORAL
  Filled 2015-11-03 (×3): qty 2

## 2015-11-03 MED ORDER — COCONUT OIL OIL: 1.0000 "application " | TOPICAL_OIL | Status: DC | PRN

## 2015-11-03 NOTE — Anesthesia Postprocedure Evaluation (Signed)
Anesthesia Post Note  Patient: Debra Palmer  Procedure(s) Performed: * No procedures listed *  Patient location during evaluation: Mother Baby Anesthesia Type: Epidural Level of consciousness: awake and alert Pain management: pain level controlled Vital Signs Assessment: post-procedure vital signs reviewed and stable Respiratory status: spontaneous breathing, nonlabored ventilation and respiratory function stable Cardiovascular status: stable Postop Assessment: no headache, no backache and epidural receding Anesthetic complications: no     Last Vitals:  Vitals:   11/03/15 1658 11/03/15 1800  BP: 103/63 (!) 92/58  Pulse: 94 85  Resp: 18 18  Temp: 36.8 C 37.2 C    Last Pain:  Vitals:   11/03/15 1800  TempSrc: Oral  PainSc: 0-No pain   Pain Goal: Patients Stated Pain Goal: 0 (11/03/15 0926)               Junious SilkGILBERT,Coltyn Hanning

## 2015-11-03 NOTE — MAU Note (Signed)
C/o  ucs since 0345 this Am; denies any leaking or bleeding;

## 2015-11-03 NOTE — MAU Note (Signed)
Pt C/O uc's since 0345, denies bleeding or LOF.

## 2015-11-03 NOTE — H&P (Signed)
  Debra Palmer is a 30 y.o. female , G2P1, presenting for regular uterine contractions. Denies bleeding or LOF. Reports good FM  Pregnancy followed at [facility] since 14  weeks and remarkable for:  1. Anterior placenta previa at 20 weeks ultrasound, resolved at 24 weeks 2. Generalized pruritus in 3rd trimester with normal bile acids and CMP on 10/11/15 3. BPD growth lag at 24 weeks with normal growth ultrasound at 36+6 weeks: EFW 6 lbs 3 oz and normal AFI  OB History    Gravida Para Term Preterm AB Living   3 1 1   1 1    SAB TAB Ectopic Multiple Live Births   1       1     Past Medical History:  Diagnosis Date  . Panic attack    Past Surgical History:  Procedure Laterality Date  . ABDOMINAL SURGERY      Family History:   family history is not contributory Social History:    reports that she has never smoked. She has never used smokeless tobacco. She reports that she does not drink alcohol or use drugs.   Prenatal labs: ABO, Rh:  A+ Antibody:  negative Rubella: immune RPR:   non-reactive HBsAg:   negative HIV:   negative GBS:  negative    Prenatal Transfer Tool  Maternal Diabetes: No Genetic Screening: Normal Quad screen Maternal Ultrasounds/Referrals: Normal other than resolved placenta previa Fetal Ultrasounds or other Referrals:  None Maternal Substance Abuse:  No Significant Maternal Medications:  None Significant Maternal Lab Results: None   Dilation: 4 Effacement (%): 80 Station: -2 Exam by:: Debra ColaDee Carter RN Blood pressure 97/78, pulse (!) 126, temperature 98.4 F (36.9 C), temperature source Oral, resp. rate 20, height 5\' 3"  (1.6 m), weight 146 lb (66.2 kg), last menstrual period 02/19/2015.  General Appearance: Alert, appropriate appearance for age. No acute distress HEENT Exam: Grossly normal Chest/Respiratory Exam: Normal chest wall and respirations. Clear to auscultation  Cardiovascular Exam: Regular rate and rhythm. S1, S2, no  murmur Gastrointestinal Exam: soft, non-tender, Uterus gravid with size compatible with GA, Vertex presentation by Leopold's maneuvers Psychiatric Exam: Alert and oriented, appropriate affect  ++++++++++++++++++++++++++++++++++++++++++++++++++++++++++++++++  Vaginal exam: 6/80/-1 AROM with clear fluid and bloody show at upper limit of normal  Fetal tracings: Category 1  ++++++++++++++++++++++++++++++++++++++++++++++++++++++++++++++++   Assessment/Plan:  39 week IUP in active labor with reassuring fetal tracings Will monitor bleeding closely SVD expected Patient, husband and family updated. Questions answered   Debra LaySandra Damariz Paganelli MD 11/03/2015, 9:58 AM

## 2015-11-03 NOTE — Anesthesia Preprocedure Evaluation (Signed)
Anesthesia Evaluation  Patient identified by MRN, date of birth, ID band Patient awake    Reviewed: Allergy & Precautions, H&P , Patient's Chart, lab work & pertinent test results  Airway Mallampati: I  TM Distance: >3 FB Neck ROM: full    Dental no notable dental hx.    Pulmonary neg pulmonary ROS,    Pulmonary exam normal        Cardiovascular negative cardio ROS Normal cardiovascular exam     Neuro/Psych negative neurological ROS  negative psych ROS   GI/Hepatic negative GI ROS, Neg liver ROS,   Endo/Other  negative endocrine ROS  Renal/GU negative Renal ROS     Musculoskeletal   Abdominal Normal abdominal exam  (+)   Peds  Hematology negative hematology ROS (+)   Anesthesia Other Findings   Reproductive/Obstetrics (+) Pregnancy                             Anesthesia Physical Anesthesia Plan  ASA: II  Anesthesia Plan: Epidural   Post-op Pain Management:    Induction:   Airway Management Planned:   Additional Equipment:   Intra-op Plan:   Post-operative Plan:   Informed Consent: I have reviewed the patients History and Physical, chart, labs and discussed the procedure including the risks, benefits and alternatives for the proposed anesthesia with the patient or authorized representative who has indicated his/her understanding and acceptance.     Plan Discussed with:   Anesthesia Plan Comments:         Anesthesia Quick Evaluation  

## 2015-11-03 NOTE — Anesthesia Procedure Notes (Signed)
Epidural Patient location during procedure: OB Start time: 11/03/2015 9:47 AM End time: 11/03/2015 9:51 AM  Staffing Anesthesiologist: Leilani AbleHATCHETT, Viaan Knippenberg Performed: anesthesiologist   Preanesthetic Checklist Completed: patient identified, surgical consent, pre-op evaluation, timeout performed, IV checked, risks and benefits discussed and monitors and equipment checked  Epidural Patient position: sitting Prep: site prepped and draped and DuraPrep Patient monitoring: continuous pulse ox and blood pressure Approach: midline Location: L3-L4 Injection technique: LOR air  Needle:  Needle type: Tuohy  Needle gauge: 17 G Needle length: 9 cm and 9 Needle insertion depth: 5 cm cm Catheter type: closed end flexible Catheter size: 19 Gauge Catheter at skin depth: 10 cm Test dose: negative and Other  Assessment Sensory level: T9 Events: blood not aspirated, injection not painful, no injection resistance, negative IV test and no paresthesia  Additional Notes Reason for block:procedure for pain

## 2015-11-04 LAB — CBC
HEMATOCRIT: 37.5 % (ref 36.0–46.0)
HEMOGLOBIN: 12.9 g/dL (ref 12.0–15.0)
MCH: 32 pg (ref 26.0–34.0)
MCHC: 34.4 g/dL (ref 30.0–36.0)
MCV: 93.1 fL (ref 78.0–100.0)
Platelets: 127 10*3/uL — ABNORMAL LOW (ref 150–400)
RBC: 4.03 MIL/uL (ref 3.87–5.11)
RDW: 13.4 % (ref 11.5–15.5)
WBC: 12.3 10*3/uL — ABNORMAL HIGH (ref 4.0–10.5)

## 2015-11-04 NOTE — Progress Notes (Signed)
MOB was referred for history of depression/anxiety. * Referral screened out by Clinical Social Worker because none of the following criteria appear to apply: ~ History of anxiety/depression during this pregnancy, or of post-partum depression. ~ Diagnosis of anxiety and/or depression within last 3 years OR * MOB's symptoms currently being treated with medication and/or therapy.  Per  MOB, MOB receives routinely counseling service with Arnette FeltsLylan Wingfield [address]  [place], [place] [id number] [phone] .  CSW educated MOB and FOB about PPD. CSW informed MOB of possible supports and interventions to decrease PPD.  CSW also encouraged MOB to seek medical attention if needed for increased signs and symptoms for PPD.   Please contact the Clinical Social Worker if needs arise, or if MOB requests. Blaine HamperAngel Boyd-Gilyard, MSW, LCSW Clinical Social Work [phone]

## 2015-11-04 NOTE — Progress Notes (Signed)
UR chart review completed.  

## 2015-11-04 NOTE — Discharge Summary (Signed)
OB Discharge Summary     Patient Name: Debra Palmer DOB: 03/20/1985 MRN: [id number]  Date of admission: 11/03/2015 Delivering MD: Silverio LayIVARD, SANDRA   Date of discharge: 11/04/2015  Admitting diagnosis: 39 wks; Contractions  Intrauterine pregnancy: 4317w0d     Secondary diagnosis:  Active Problems:   Normal labor   SVD (spontaneous vaginal delivery)  Additional problems: None     Discharge diagnosis: Term Pregnancy Delivered                                                                                                Post partum procedures:None  Augmentation: AROM  Complications: None  Hospital course:  Onset of Labor With Vaginal Delivery     [age] y.o. yo [id number] at 417w0d was admitted in Active Labor on 11/03/2015. Patient had an uncomplicated labor course as follows:  Membrane Rupture Time/Date: 12:01 PM ,11/03/2015   Intrapartum Procedures: Episiotomy: None [1]                                         Lacerations:  Periurethral [8]  Patient had a delivery of a Viable female infant. 11/03/2015  Information for the patient's newborn:  Blima Richllinwood, Boy Calais [[id number]][[id number]]  Delivery Method: Vaginal, Spontaneous Delivery (Filed from Delivery Summary)    Pateint had an uncomplicated postpartum course.  She is ambulating, tolerating a regular diet, passing flatus, and urinating well. Patient is discharged home in stable condition on 11/04/15.    Physical exam Vitals:   11/03/15 1658 11/03/15 1800 11/03/15 2200 11/04/15 0543  BP: 103/63 (!) 92/58 (!) 95/52 97/62  Pulse: 94 85 86 73  Resp: 18 18 18 18   Temp: 98.3 F (36.8 C) 99 F (37.2 C) 98.2 F (36.8 C) 98.5 F (36.9 C)  TempSrc: Oral Oral Oral Oral  SpO2:      Weight:      Height:       General: alert, cooperative and no distress Lochia: appropriate Uterine Fundus: firm Incision: N/A DVT Evaluation: No evidence of DVT seen on physical exam. Labs: Lab Results  Component Value Date   WBC 12.3 (H) 11/04/2015   HGB 12.9 11/04/2015   HCT 37.5 11/04/2015   MCV 93.1 11/04/2015   PLT 127 (L) 11/04/2015   CMP Latest Ref Rng & Units 08/10/2015  Glucose 65 - 99 mg/dL 91  BUN 6 - 20 mg/dL 8  Creatinine 1.300.44 - 8.651.00 mg/dL 7.840.66  Sodium 696135 - 295145 mmol/L 135  Potassium 3.5 - 5.1 mmol/L 3.6  Chloride 101 - 111 mmol/L 104  CO2 22 - 32 mmol/L 24  Calcium 8.9 - 10.3 mg/dL 9.2  Total Protein 6.5 - 8.1 g/dL 6.6  Total Bilirubin 0.3 - 1.2 mg/dL 0.5  Alkaline Phos 38 - 126 U/L 69  AST 15 - 41 U/L 18  ALT 14 - 54 U/L 17    Discharge instruction: per After Visit Summary and "Baby and Me Booklet".  After visit meds:    Medication List  You have not been prescribed any medications.     Diet: routine diet  Activity: Advance as tolerated. Pelvic rest for 6 weeks.   Outpatient follow up:6 weeks Follow up Appt:No future appointments. Follow up Visit:No Follow-up on file.  Postpartum contraception: IUD Mirena  Newborn Data: Live born female  Birth Weight: 6 lb 12.3 oz (3070 g) APGAR: 9, 9  Baby Feeding: Bottle Disposition:home with mother  Outpatient circ.   11/04/2015 Kenney HousemanNancy Jean Jawara Latorre, CNM

## 2016-08-17 ENCOUNTER — Encounter (HOSPITAL_COMMUNITY): Payer: Self-pay

## 2016-08-17 ENCOUNTER — Emergency Department (HOSPITAL_COMMUNITY)
Admission: EM | Admit: 2016-08-17 | Discharge: 2016-08-18 | Disposition: A | Discharge status: Home / Self Care | Payer: Medicaid Other | Attending: Emergency Medicine | Admitting: Emergency Medicine

## 2016-08-17 DIAGNOSIS — R112 Nausea with vomiting, unspecified: Secondary | ICD-10-CM | POA: Insufficient documentation

## 2016-08-17 DIAGNOSIS — R1013 Epigastric pain: Secondary | ICD-10-CM | POA: Insufficient documentation

## 2016-08-17 DIAGNOSIS — Z79899 Other long term (current) drug therapy: Secondary | ICD-10-CM | POA: Insufficient documentation

## 2016-08-17 LAB — URINALYSIS, ROUTINE W REFLEX MICROSCOPIC
BILIRUBIN URINE: NEGATIVE
GLUCOSE, UA: NEGATIVE mg/dL
HGB URINE DIPSTICK: NEGATIVE
KETONES UR: 80 mg/dL — AB
LEUKOCYTES UA: NEGATIVE
NITRITE: NEGATIVE
PH: 5 (ref 5.0–8.0)
Protein, ur: 100 mg/dL — AB
Specific Gravity, Urine: 1.03 (ref 1.005–1.030)

## 2016-08-17 LAB — COMPREHENSIVE METABOLIC PANEL
ALK PHOS: 44 U/L (ref 38–126)
ALT: 24 U/L (ref 14–54)
ANION GAP: 11 (ref 5–15)
AST: 27 U/L (ref 15–41)
Albumin: 4.8 g/dL (ref 3.5–5.0)
BUN: 16 mg/dL (ref 6–20)
CALCIUM: 9.5 mg/dL (ref 8.9–10.3)
CO2: 21 mmol/L — AB (ref 22–32)
Chloride: 106 mmol/L (ref 101–111)
Creatinine, Ser: 0.98 mg/dL (ref 0.44–1.00)
GFR calc non Af Amer: >60 mL/min (ref >60)
Glucose, Bld: 125 mg/dL — ABNORMAL HIGH (ref 65–99)
Potassium: 3.5 mmol/L (ref 3.5–5.1)
SODIUM: 138 mmol/L (ref 135–145)
TOTAL PROTEIN: 7.7 g/dL (ref 6.5–8.1)
Total Bilirubin: 0.9 mg/dL (ref 0.3–1.2)

## 2016-08-17 LAB — CBC
HCT: 43.8 % (ref 36.0–46.0)
HEMOGLOBIN: 15.5 g/dL — AB (ref 12.0–15.0)
MCH: 31.4 pg (ref 26.0–34.0)
MCHC: 35.4 g/dL (ref 30.0–36.0)
MCV: 88.8 fL (ref 78.0–100.0)
Platelets: 238 10*3/uL (ref 150–400)
RBC: 4.93 MIL/uL (ref 3.87–5.11)
RDW: 12.4 % (ref 11.5–15.5)
WBC: 9.7 10*3/uL (ref 4.0–10.5)

## 2016-08-17 LAB — LIPASE, BLOOD: Lipase: 30 U/L (ref 11–51)

## 2016-08-17 MED ORDER — OXYCODONE-ACETAMINOPHEN 5-325 MG PO TABS
ORAL_TABLET | ORAL | Status: AC
  Filled 2016-08-17: qty 1

## 2016-08-17 MED ORDER — METOCLOPRAMIDE HCL 5 MG/ML IJ SOLN
10.0000 mg | Freq: Once | INTRAMUSCULAR | Status: AC
Start: 2016-08-17 — End: 2016-08-17
  Administered 2016-08-17: 10 mg via INTRAMUSCULAR
  Filled 2016-08-17: qty 2

## 2016-08-17 MED ORDER — ONDANSETRON 4 MG PO TBDP: 4.0000 mg | ORAL_TABLET | Freq: Once | ORAL | Status: DC | PRN

## 2016-08-17 MED ORDER — OXYCODONE-ACETAMINOPHEN 5-325 MG PO TABS
1.0000 | ORAL_TABLET | ORAL | Status: DC | PRN
Start: 2016-08-17 — End: 2016-08-18

## 2016-08-17 NOTE — ED Notes (Signed)
Pt's father up to desk.  Updated on delays and apologized for wait

## 2016-08-17 NOTE — ED Notes (Addendum)
Pt c/o of nausea and states she wants to leave, due to long wait and no one taking care of her nausea. Zofran given per standing orders.

## 2016-08-17 NOTE — ED Notes (Signed)
Pt is now throwing up, reports taking zofran at home. Unable to give percocet

## 2016-08-17 NOTE — ED Triage Notes (Signed)
Pt presents to the ed with complaints of severe middle abdominal pain. It started this morning at 0600 and has gotten worse associated with nausea. The patient has been having off and on abdominal pain x 9 years with multiple tests and no answers. The patient is very upset about it.

## 2016-08-18 LAB — I-STAT BETA HCG BLOOD, ED (MC, WL, AP ONLY): I-stat hCG, quantitative: <5 m[IU]/mL (ref <5)

## 2016-08-18 LAB — CBC
HEMATOCRIT: 42.6 % (ref 36.0–46.0)
Hemoglobin: 15.1 g/dL — ABNORMAL HIGH (ref 12.0–15.0)
MCH: 31.7 pg (ref 26.0–34.0)
MCHC: 35.4 g/dL (ref 30.0–36.0)
MCV: 89.3 fL (ref 78.0–100.0)
PLATELETS: 222 10*3/uL (ref 150–400)
RBC: 4.77 MIL/uL (ref 3.87–5.11)
RDW: 12.4 % (ref 11.5–15.5)
WBC: 10.5 10*3/uL (ref 4.0–10.5)

## 2016-08-18 MED ORDER — GI COCKTAIL ~~LOC~~
30.0000 mL | Freq: Once | ORAL | Status: AC
  Administered 2016-08-18: 30 mL via ORAL
  Filled 2016-08-18: qty 30

## 2016-08-18 MED ORDER — PROMETHAZINE HCL 25 MG/ML IJ SOLN
25.0000 mg | Freq: Four times a day (QID) | INTRAMUSCULAR | Status: DC | PRN
  Administered 2016-08-18: 25 mg via INTRAVENOUS
  Filled 2016-08-18: qty 1

## 2016-08-18 MED ORDER — PROMETHAZINE HCL 25 MG PO TABS: 25.0000 mg | ORAL_TABLET | Freq: Four times a day (QID) | ORAL | 0 refills | Status: DC | PRN

## 2016-08-18 MED ORDER — SODIUM CHLORIDE 0.9 % IV BOLUS (SEPSIS)
1000.0000 mL | Freq: Once | INTRAVENOUS | Status: AC
  Administered 2016-08-18: 1000 mL via INTRAVENOUS

## 2016-08-18 MED ORDER — SUCRALFATE 1 G PO TABS
1.0000 g | ORAL_TABLET | Freq: Once | ORAL | Status: AC
  Administered 2016-08-18: 1 g via ORAL
  Filled 2016-08-18: qty 1

## 2016-08-18 MED ORDER — SUCRALFATE 1 G PO TABS: 1.0000 g | ORAL_TABLET | Freq: Three times a day (TID) | ORAL | 0 refills | Status: DC

## 2016-08-18 NOTE — Discharge Instructions (Signed)
It was my pleasure taking care of you today!   Fortunately, your lab work was very reassuring. At this time, there does not appear to be an acute, emergent cause for your abdominal pain. However, this does not mean that your abdominal pain may not become an emergency in the future. It is VERY important that you monitor your symptoms and return to the Emergency Department if you develop any of the following symptoms:  The pain does not go away.  You have a fever.  You keep throwing up and can't keep fluids down.  You pass bloody or black tarry stools.  There is bright red blood in the stool. You do not seem to be getting better.  You have any questions or concerns.

## 2016-08-18 NOTE — ED Notes (Signed)
Pt verbalized understanding of d/c instructions and has no further questions. Pt is stable, A&Ox4, VSS.  

## 2016-08-18 NOTE — ED Provider Notes (Signed)
[facility] DEPT Provider Note   CSN: [id number] Arrival date & time: 08/17/16  1306     History   Chief Complaint Chief Complaint  Patient presents with  . Abdominal Pain    HPI Debra Palmer is a 31 y.o. female.  The history is provided by the patient and medical records. No language interpreter was used.  Abdominal Pain   Associated symptoms include nausea and vomiting. Pertinent negatives include diarrhea.   Debra Palmer is a 31 y.o. female  who presents to the Emergency Department complaining of periumbilical abdominal pain x 1 week. Patient states that pain occurs each morning and will typically last about 2-3 hours then resolve. Often will improve after taking a warm bath. Today, pain began suddenly at 6 am. It lasted for about an hour and a half then resolved. A little later, pain returned and has been persistent since. Went to urgent care earlier who informed her to come to ER for further evaluation. Zofran and Reglan given in ED with little improvement. Still has been vomiting in waiting room after meds. Associated with nausea and multiple episodes of emesis today. No fever or chills. No diarrhea or blood in the stool. Last BM was this morning which was a little harder than usual, but BM's have been regular. No dysuria, urinary urgency/frequency, vaginal discharge. Patient states that she experienced similar pain three years ago. She notes that CT scan at that time was negative. She was referred to GI and had endoscopy done which she also was told was normal. Sxs thought to be possibly due to reflux which she has been taking prevacid for twice a day.    Past Medical History:  Diagnosis Date  . Panic attack     Patient Active Problem List   Diagnosis Date Noted  . Normal labor 11/03/2015  . SVD (spontaneous vaginal delivery) 11/03/2015    Past Surgical History:  Procedure Laterality Date  . ABDOMINAL SURGERY      OB History    Gravida Para Term  Preterm AB Living   3 2 2   1 2    SAB TAB Ectopic Multiple Live Births   1     0 2       Home Medications    Prior to Admission medications   Medication Sig Start Date End Date Taking? Authorizing Provider  lansoprazole (PREVACID) 15 MG capsule Take 15 mg by mouth 2 (two) times daily before a meal.   Yes [provider]  levonorgestrel (MIRENA) 20 MCG/24HR IUD 1 each by Intrauterine route once.   Yes [provider]  promethazine (PHENERGAN) 25 MG tablet Take 1 tablet (25 mg total) by mouth every 6 (six) hours as needed for nausea or vomiting. 08/18/16   Kenai Fluegel, Chase Picket, PA-C  sucralfate (CARAFATE) 1 g tablet Take 1 tablet (1 g total) by mouth 4 (four) times daily -  with meals and at bedtime. 08/18/16   Glynnis Gavel, Chase Picket, PA-C    Family History Family History  Problem Relation Age of Onset  . Alcohol abuse Neg Hx   . Arthritis Neg Hx   . Asthma Neg Hx   . Birth defects Neg Hx   . Cancer Neg Hx   . COPD Neg Hx   . Depression Neg Hx   . Diabetes Neg Hx   . Drug abuse Neg Hx   . Early death Neg Hx   . Hearing loss Neg Hx   . Heart disease Neg Hx   .  Hyperlipidemia Neg Hx   . Hypertension Neg Hx   . Kidney disease Neg Hx   . Learning disabilities Neg Hx   . Mental illness Neg Hx   . Mental retardation Neg Hx   . Miscarriages / Stillbirths Neg Hx   . Stroke Neg Hx   . Vision loss Neg Hx   . Varicose Veins Neg Hx     Social History Social History  Substance Use Topics  . Smoking status: Never Smoker  . Smokeless tobacco: Never Used  . Alcohol use No     Allergies   Patient has no known allergies.   Review of Systems Review of Systems  Gastrointestinal: Positive for abdominal pain, nausea and vomiting. Negative for blood in stool and diarrhea.  All other systems reviewed and are negative.    Physical Exam Updated Vital Signs BP 116/77   Pulse 70   Temp 98.3 F (36.8 C) (Oral)   Resp 19   Ht 5\' 2"  (1.575 m)   Wt 54.4 kg (120 lb)    LMP 08/02/2016   SpO2 98%   BMI 21.95 kg/m   Physical Exam  Constitutional: She is oriented to person, place, and time. She appears well-developed and well-nourished. No distress.  HENT:  Head: Normocephalic and atraumatic.  Cardiovascular: Normal rate, regular rhythm and normal heart sounds.   No murmur heard. Pulmonary/Chest: Effort normal and breath sounds normal. No respiratory distress.  Abdominal: Soft. Bowel sounds are normal. She exhibits no distension. There is tenderness (Periumbilical and epigastric). There is no rebound.  Musculoskeletal: She exhibits no edema.  Neurological: She is alert and oriented to person, place, and time.  Skin: Skin is warm and dry.  Nursing note and vitals reviewed.    ED Treatments / Results  Labs (all labs ordered are listed, but only abnormal results are displayed) Labs Reviewed  COMPREHENSIVE METABOLIC PANEL - Abnormal; Notable for the following:       Result Value   CO2 21 (*)    Glucose, Bld 125 (*)    All other components within normal limits  CBC - Abnormal; Notable for the following:    Hemoglobin 15.5 (*)    All other components within normal limits  URINALYSIS, ROUTINE W REFLEX MICROSCOPIC - Abnormal; Notable for the following:    APPearance HAZY (*)    Ketones, ur 80 (*)    Protein, ur 100 (*)    Bacteria, UA FEW (*)    Squamous Epithelial / LPF 0-5 (*)    All other components within normal limits  CBC - Abnormal; Notable for the following:    Hemoglobin 15.1 (*)    All other components within normal limits  LIPASE, BLOOD  I-STAT BETA HCG BLOOD, ED ([facility], [facility], [facility] ONLY)    EKG  EKG Interpretation None       Radiology No results found.  Procedures Procedures (including critical care time)  Medications Ordered in ED Medications  oxyCODONE-acetaminophen (PERCOCET/ROXICET) 5-325 MG per tablet 1 tablet (not administered)  ondansetron (ZOFRAN-ODT) disintegrating tablet 4 mg (4 mg Oral Not Given 08/17/16 2334)    promethazine (PHENERGAN) injection 25 mg (25 mg Intravenous Given 08/18/16 0059)  metoCLOPramide (REGLAN) injection 10 mg (10 mg Intramuscular Given 08/17/16 1328)  sodium chloride 0.9 % bolus 1,000 mL (0 mLs Intravenous Stopped 08/18/16 0151)  sodium chloride 0.9 % bolus 1,000 mL (0 mLs Intravenous Stopped 08/18/16 0245)  gi cocktail (Maalox,Lidocaine,Donnatal) (30 mLs Oral Given 08/18/16 0154)  sucralfate (CARAFATE) tablet 1 g (  1 g Oral Given 08/18/16 0250)     Initial Impression / Assessment and Plan / ED Course  I have reviewed the triage vital signs and the nursing notes.  Pertinent labs & imaging results that were available during my care of the patient were reviewed by me and considered in my medical decision making (see chart for details).    Debra Palmer is a 31 y.o. female who presents to ED for periumbilical and epigastric pain. This flare has been occurring each morning for the last week, however today pain did not improve as it typically does. Patient states that she has had similar pain several times in the past but has never been given an explanation for symptoms. Today, patient is nontoxic, nonseptic appearing with a non-surgical abdominal exam. Labs reviewed and reassuring. Fluid bolus given. Symptomatic medications given.   On repeat exam, abdominal exam improved with no peritoneal signs. No indication of appendicitis, bowel obstruction, bowel perforation, cholecystitis, diverticulitis, PID or ectopic pregnancy. Patient discharged home with symptomatic treatment and encouraged to follow up with GI. I have also discussed reasons to return immediately to the ER. Patient expresses understanding and agrees with plan as dictated above.   Patient seen by and discussed with Dr. Ranae Palms who agrees with treatment plan.    Final Clinical Impressions(s) / ED Diagnoses   Final diagnoses:  Epigastric pain    New Prescriptions New Prescriptions   PROMETHAZINE (PHENERGAN) 25  MG TABLET    Take 1 tablet (25 mg total) by mouth every 6 (six) hours as needed for nausea or vomiting.   SUCRALFATE (CARAFATE) 1 G TABLET    Take 1 tablet (1 g total) by mouth 4 (four) times daily -  with meals and at bedtime.     Kenslei Hearty, Chase Picket, PA-C 08/18/16 4098    Loren Racer, MD 08/24/16 1501

## 2018-08-26 ENCOUNTER — Other Ambulatory Visit: Payer: Self-pay

## 2018-08-26 ENCOUNTER — Inpatient Hospital Stay (HOSPITAL_COMMUNITY): Payer: Medicaid Other

## 2018-08-26 ENCOUNTER — Encounter (HOSPITAL_COMMUNITY): Payer: Self-pay | Admitting: *Deleted

## 2018-08-26 ENCOUNTER — Inpatient Hospital Stay (HOSPITAL_COMMUNITY)
Admission: AD | Admit: 2018-08-26 | Discharge: 2018-08-26 | Disposition: A | Discharge status: Home / Self Care | Payer: Medicaid Other | Attending: Obstetrics and Gynecology | Admitting: Obstetrics and Gynecology

## 2018-08-26 DIAGNOSIS — O219 Vomiting of pregnancy, unspecified: Secondary | ICD-10-CM | POA: Insufficient documentation

## 2018-08-26 DIAGNOSIS — R1013 Epigastric pain: Secondary | ICD-10-CM | POA: Diagnosis not present

## 2018-08-26 DIAGNOSIS — Z79899 Other long term (current) drug therapy: Secondary | ICD-10-CM | POA: Diagnosis not present

## 2018-08-26 DIAGNOSIS — Z3A01 Less than 8 weeks gestation of pregnancy: Secondary | ICD-10-CM | POA: Insufficient documentation

## 2018-08-26 DIAGNOSIS — O26899 Other specified pregnancy related conditions, unspecified trimester: Secondary | ICD-10-CM

## 2018-08-26 DIAGNOSIS — R109 Unspecified abdominal pain: Secondary | ICD-10-CM | POA: Insufficient documentation

## 2018-08-26 DIAGNOSIS — O26891 Other specified pregnancy related conditions, first trimester: Secondary | ICD-10-CM | POA: Insufficient documentation

## 2018-08-26 DIAGNOSIS — Z793 Long term (current) use of hormonal contraceptives: Secondary | ICD-10-CM | POA: Insufficient documentation

## 2018-08-26 LAB — COMPREHENSIVE METABOLIC PANEL
ALT: 44 U/L (ref 0–44)
AST: 23 U/L (ref 15–41)
Albumin: 4.6 g/dL (ref 3.5–5.0)
Alkaline Phosphatase: 49 U/L (ref 38–126)
Anion gap: 11 (ref 5–15)
BUN: 13 mg/dL (ref 6–20)
CO2: 20 mmol/L — ABNORMAL LOW (ref 22–32)
Calcium: 9.7 mg/dL (ref 8.9–10.3)
Chloride: 102 mmol/L (ref 98–111)
Creatinine, Ser: 0.88 mg/dL (ref 0.44–1.00)
GFR calc Af Amer: >60 mL/min (ref >60)
GFR calc non Af Amer: >60 mL/min (ref >60)
Glucose, Bld: 108 mg/dL — ABNORMAL HIGH (ref 70–99)
Potassium: 3.1 mmol/L — ABNORMAL LOW (ref 3.5–5.1)
Sodium: 133 mmol/L — ABNORMAL LOW (ref 135–145)
Total Bilirubin: 0.8 mg/dL (ref 0.3–1.2)
Total Protein: 7.7 g/dL (ref 6.5–8.1)

## 2018-08-26 LAB — WET PREP, GENITAL
Clue Cells Wet Prep HPF POC: NONE SEEN
Sperm: NONE SEEN
Trich, Wet Prep: NONE SEEN

## 2018-08-26 LAB — POCT PREGNANCY, URINE: Preg Test, Ur: POSITIVE — AB

## 2018-08-26 LAB — URINALYSIS, ROUTINE W REFLEX MICROSCOPIC
Bilirubin Urine: NEGATIVE
Glucose, UA: NEGATIVE mg/dL
Hgb urine dipstick: NEGATIVE
Ketones, ur: 80 mg/dL — AB
Nitrite: NEGATIVE
Protein, ur: 30 mg/dL — AB
Specific Gravity, Urine: 1.026 (ref 1.005–1.030)
pH: 8 (ref 5.0–8.0)

## 2018-08-26 LAB — CBC
HCT: 42.1 % (ref 36.0–46.0)
Hemoglobin: 14.3 g/dL (ref 12.0–15.0)
MCH: 31.7 pg (ref 26.0–34.0)
MCHC: 34 g/dL (ref 30.0–36.0)
MCV: 93.3 fL (ref 80.0–100.0)
Platelets: 253 10*3/uL (ref 150–400)
RBC: 4.51 MIL/uL (ref 3.87–5.11)
RDW: 12.9 % (ref 11.5–15.5)
WBC: 14.7 10*3/uL — ABNORMAL HIGH (ref 4.0–10.5)
nRBC: 0 % (ref 0.0–0.2)

## 2018-08-26 LAB — HCG, QUANTITATIVE, PREGNANCY: hCG, Beta Chain, Quant, S: 240748 m[IU]/mL — ABNORMAL HIGH (ref <5)

## 2018-08-26 LAB — ABO/RH: ABO/RH(D): A POS

## 2018-08-26 MED ORDER — SODIUM CHLORIDE 0.9 % IV SOLN
Freq: Once | INTRAVENOUS | Status: AC
  Administered 2018-08-26: 20:00:00 via INTRAVENOUS

## 2018-08-26 MED ORDER — SCOPOLAMINE 1 MG/3DAYS TD PT72
1.0000 | MEDICATED_PATCH | Freq: Once | TRANSDERMAL | Status: DC
  Administered 2018-08-26: 1.5 mg via TRANSDERMAL
  Filled 2018-08-26: qty 1

## 2018-08-26 MED ORDER — FAMOTIDINE IN NACL 20-0.9 MG/50ML-% IV SOLN
20.0000 mg | Freq: Once | INTRAVENOUS | Status: AC
  Administered 2018-08-26: 20 mg via INTRAVENOUS
  Filled 2018-08-26: qty 50

## 2018-08-26 MED ORDER — LACTATED RINGERS IV BOLUS
1000.0000 mL | Freq: Once | INTRAVENOUS | Status: AC
  Administered 2018-08-26: 18:00:00 1000 mL via INTRAVENOUS

## 2018-08-26 MED ORDER — DEXAMETHASONE SODIUM PHOSPHATE 10 MG/ML IJ SOLN
8.0000 mg | Freq: Once | INTRAMUSCULAR | Status: AC
  Administered 2018-08-26: 21:00:00 8 mg via INTRAVENOUS
  Filled 2018-08-26: qty 1

## 2018-08-26 MED ORDER — PROMETHAZINE HCL 25 MG/ML IJ SOLN: 25.0000 mg | Freq: Once | INTRAMUSCULAR | Status: DC

## 2018-08-26 MED ORDER — METOCLOPRAMIDE HCL 5 MG/ML IJ SOLN
5.0000 mg | Freq: Once | INTRAMUSCULAR | Status: AC
  Administered 2018-08-26: 21:00:00 5 mg via INTRAVENOUS
  Filled 2018-08-26: qty 2

## 2018-08-26 MED ORDER — PROMETHAZINE HCL 25 MG PO TABS: 25.0000 mg | ORAL_TABLET | Freq: Four times a day (QID) | ORAL | 0 refills | Status: DC | PRN

## 2018-08-26 MED ORDER — PROMETHAZINE HCL 25 MG/ML IJ SOLN
25.0000 mg | Freq: Once | INTRAMUSCULAR | Status: AC
  Administered 2018-08-26: 18:00:00 25 mg via INTRAVENOUS
  Filled 2018-08-26: qty 1

## 2018-08-26 MED ORDER — ONDANSETRON HCL 4 MG/2ML IJ SOLN
4.0000 mg | Freq: Once | INTRAMUSCULAR | Status: AC
  Administered 2018-08-26: 4 mg via INTRAVENOUS
  Filled 2018-08-26: qty 2
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
## 2018-08-26 NOTE — MAU Note (Signed)
Hasn't been able to eat anything, is so nauseous.  Has thrown up so much, she is throwing up bile now.  Having pain, right behind her belly button, the pain woke her.

## 2018-08-26 NOTE — Discharge Instructions (Signed)
° °Abdominal Pain During Pregnancy ° °Belly (abdominal) pain is common during pregnancy. There are many possible causes. Most of the time, it is not a serious problem. Other times, it can be a sign that something is wrong with the pregnancy. Always tell your doctor if you have belly pain. °Follow these instructions at home: °· Do not have sex or put anything in your vagina until your pain goes away completely. °· Get plenty of rest until your pain gets better. °· Drink enough fluid to keep your pee (urine) pale yellow. °· Take over-the-counter and prescription medicines only as told by your doctor. °· Keep all follow-up visits as told by your doctor. This is important. °Contact a doctor if: °· Your pain continues or gets worse after resting. °· You have lower belly pain that: °? Comes and goes at regular times. °? Spreads to your back. °? Feels like menstrual cramps. °· You have pain or burning when you pee (urinate). °Get help right away if: °· You have a fever or chills. °· You have vaginal bleeding. °· You are leaking fluid from your vagina. °· You are passing tissue from your vagina. °· You throw up (vomit) for more than 24 hours. °· You have watery poop (diarrhea) for more than 24 hours. °· Your baby is moving less than usual. °· You feel very weak or faint. °· You have shortness of breath. °· You have very bad pain in your upper belly. °Summary °· Belly (abdominal) pain is common during pregnancy. There are many possible causes. °· If you have belly pain during pregnancy, tell your doctor right away. °· Keep all follow-up visits as told by your doctor. This is important. °This information is not intended to replace advice given to you by your health care provider. Make sure you discuss any questions you have with your health care provider. °Document Released: 12/06/2008 Document Revised: 04/07/2018 Document Reviewed: 03/22/2016 °Elsevier Patient Education © 2020 [employer] °Morning Sickness ° °Morning  sickness is when a woman feels nauseous during pregnancy. This nauseous feeling may or may not come with vomiting. It often occurs in the morning, but it can be a problem at any time of day. Morning sickness is most common during the first trimester. In some cases, it may continue throughout pregnancy. Although morning sickness is unpleasant, it is usually harmless unless the woman develops severe and continual vomiting (hyperemesis gravidarum), a condition that requires more intense treatment. °What are the causes? °The exact cause of this condition is not known, but it seems to be related to normal hormonal changes that occur in pregnancy. °What increases the risk? °You are more likely to develop this condition if: °· You experienced nausea or vomiting before your pregnancy. °· You had morning sickness during a previous pregnancy. °· You are pregnant with more than one baby, such as twins. °What are the signs or symptoms? °Symptoms of this condition include: °· Nausea. °· Vomiting. °How is this diagnosed? °This condition is usually diagnosed based on your signs and symptoms. °How is this treated? °In many cases, treatment is not needed for this condition. Making some changes to what you eat may help to control symptoms. Your health care provider may also prescribe or recommend: °· Vitamin B6 supplements. °· Anti-nausea medicines. °· Ginger. °Follow these instructions at home: °Medicines °· Take over-the-counter and prescription medicines only as told by your health care provider. Do not use any prescription, over-the-counter, or herbal medicines for morning sickness without first talking with   your health care provider.  Taking multivitamins before getting pregnant can prevent or decrease the severity of morning sickness in most women. Eating and drinking  Eat a piece of dry toast or crackers before getting out of bed in the morning.  Eat 5 or 6 small meals a day.  Eat dry and bland foods, such as rice or  a baked potato. Foods that are high in carbohydrates are often helpful.  Avoid greasy, fatty, and spicy foods.  Have someone cook for you if the smell of any food causes nausea and vomiting.  If you feel nauseous after taking prenatal vitamins, take the vitamins at night or with a snack.  Snack on protein foods between meals if you are hungry. Nuts, yogurt, and cheese are good options.  Drink fluids throughout the day.  Try ginger ale made with real ginger, ginger tea made from fresh grated ginger, or ginger candies. General instructions  Do not use any products that contain nicotine or tobacco, such as cigarettes and e-cigarettes. If you need help quitting, ask your health care provider.  Get an air purifier to keep the air in your house free of odors.  Get plenty of fresh air.  Try to avoid odors that trigger your nausea.  Consider trying these methods to help relieve symptoms: ? Wearing an acupressure wristband. These wristbands are often worn for seasickness. ? Acupuncture. Contact a health care provider if:  Your home remedies are not working and you need medicine.  You feel dizzy or light-headed.  You are losing weight. Get help right away if:  You have persistent and uncontrolled nausea and vomiting.  You faint.  You have severe pain in your abdomen. Summary  Morning sickness is when a woman feels nauseous during pregnancy. This nauseous feeling may or may not come with vomiting.  Morning sickness is most common during the first trimester.  It often occurs in the morning, but it can be a problem at any time of day.  In many cases, treatment is not needed for this condition. Making some changes to what you eat may help to control symptoms. This information is not intended to replace advice given to you by your health care provider. Make sure you discuss any questions you have with your health care provider. Document Released: 02/08/2006 Document Revised:  11/30/2016 Document Reviewed: 01/21/2016 Elsevier Patient Education  2020 [employer].

## 2018-08-28 LAB — URINE CULTURE: Culture: 70000 — AB

## 2018-08-28 LAB — GC/CHLAMYDIA PROBE AMP (~~LOC~~) NOT AT ARMC
Chlamydia: NEGATIVE
Neisseria Gonorrhea: NEGATIVE

## 2018-09-30 LAB — OB RESULTS CONSOLE RPR: RPR: NONREACTIVE

## 2018-09-30 LAB — OB RESULTS CONSOLE HIV ANTIBODY (ROUTINE TESTING): HIV: NONREACTIVE

## 2018-09-30 LAB — OB RESULTS CONSOLE RUBELLA ANTIBODY, IGM: Rubella: IMMUNE

## 2018-09-30 LAB — OB RESULTS CONSOLE ABO/RH: RH Type: POSITIVE

## 2018-09-30 LAB — OB RESULTS CONSOLE HEPATITIS B SURFACE ANTIGEN: Hepatitis B Surface Ag: NEGATIVE

## 2018-09-30 LAB — OB RESULTS CONSOLE GC/CHLAMYDIA
Chlamydia: NEGATIVE
Gonorrhea: NEGATIVE

## 2018-09-30 LAB — OB RESULTS CONSOLE ANTIBODY SCREEN: Antibody Screen: NEGATIVE

## 2018-10-06 ENCOUNTER — Other Ambulatory Visit: Payer: Self-pay

## 2018-11-04 ENCOUNTER — Other Ambulatory Visit: Payer: Self-pay

## 2018-11-04 ENCOUNTER — Inpatient Hospital Stay (HOSPITAL_COMMUNITY)
Admission: AD | Admit: 2018-11-04 | Discharge: 2018-11-04 | Discharge status: Against Medical Advice | Payer: Medicaid Other | Attending: Obstetrics and Gynecology | Admitting: Obstetrics and Gynecology

## 2018-11-04 ENCOUNTER — Encounter (HOSPITAL_COMMUNITY): Payer: Self-pay

## 2018-11-04 ENCOUNTER — Other Ambulatory Visit: Payer: Self-pay | Admitting: Women's Health

## 2018-11-04 DIAGNOSIS — G8929 Other chronic pain: Secondary | ICD-10-CM

## 2018-11-04 DIAGNOSIS — R1033 Periumbilical pain: Secondary | ICD-10-CM | POA: Diagnosis not present

## 2018-11-04 DIAGNOSIS — O99352 Diseases of the nervous system complicating pregnancy, second trimester: Secondary | ICD-10-CM | POA: Diagnosis not present

## 2018-11-04 DIAGNOSIS — F419 Anxiety disorder, unspecified: Secondary | ICD-10-CM

## 2018-11-04 DIAGNOSIS — F1290 Cannabis use, unspecified, uncomplicated: Secondary | ICD-10-CM

## 2018-11-04 DIAGNOSIS — F329 Major depressive disorder, single episode, unspecified: Secondary | ICD-10-CM

## 2018-11-04 DIAGNOSIS — R109 Unspecified abdominal pain: Secondary | ICD-10-CM

## 2018-11-04 DIAGNOSIS — F32A Depression, unspecified: Secondary | ICD-10-CM

## 2018-11-04 DIAGNOSIS — O26892 Other specified pregnancy related conditions, second trimester: Secondary | ICD-10-CM | POA: Diagnosis not present

## 2018-11-04 DIAGNOSIS — O99342 Other mental disorders complicating pregnancy, second trimester: Secondary | ICD-10-CM | POA: Insufficient documentation

## 2018-11-04 DIAGNOSIS — Z3A17 17 weeks gestation of pregnancy: Secondary | ICD-10-CM

## 2018-11-04 DIAGNOSIS — O99322 Drug use complicating pregnancy, second trimester: Secondary | ICD-10-CM

## 2018-11-04 LAB — RAPID URINE DRUG SCREEN, HOSP PERFORMED
Amphetamines: NOT DETECTED
Barbiturates: NOT DETECTED
Benzodiazepines: NOT DETECTED
Cocaine: NOT DETECTED
Opiates: NOT DETECTED
Tetrahydrocannabinol: POSITIVE — AB

## 2018-11-04 LAB — URINALYSIS, ROUTINE W REFLEX MICROSCOPIC
Bilirubin Urine: NEGATIVE
Glucose, UA: NEGATIVE mg/dL
Hgb urine dipstick: NEGATIVE
Ketones, ur: 5 mg/dL — AB
Leukocytes,Ua: NEGATIVE
Nitrite: NEGATIVE
Protein, ur: NEGATIVE mg/dL
Specific Gravity, Urine: 1.02 (ref 1.005–1.030)
pH: 6 (ref 5.0–8.0)
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
## 2018-11-04 NOTE — Progress Notes (Signed)
External referral entered for behavioral health.  Clarisa Fling, NP  10:23 AM 11/04/2018

## 2018-11-04 NOTE — MAU Note (Signed)
Debra Palmer is a 33 y.o. at [redacted]w[redacted]d here in MAU reporting: abdominal pain since yesterday, states being in the bathtub helped the pain. Also having nausea/vomiting. Took a phenergan and that helped a little bit. States when the medication wore off the n/v returned and the pain returned. Being in the bathtub helps the pain. Denies drug/alcohol use. States this problem has been ongoing for years. No bleeding or discharge. Emesis > 10 times in the last 24 hours, states she is currently not nauseated but was on the way over to the hospital.  Onset of complaint: ongoing for several years but started again yesterday  Pain score: 8/10  Vitals:   11/04/18 0805  BP: 104/69  Pulse: 81  Resp: 16  Temp: 98.1 F (36.7 C)  SpO2: 99%     FHT: 144  Lab orders placed from triage: UA

## 2018-11-04 NOTE — Discharge Instructions (Signed)
Abdominal Pain During Pregnancy  Abdominal pain is common during pregnancy, and has many possible causes. Some causes are more serious than others, and sometimes the cause is not known. Abdominal pain can be a sign that labor is starting. It can also be caused by normal growth and stretching of muscles and ligaments during pregnancy. Always tell your health care provider if you have any abdominal pain. Follow these instructions at home:  Do not have sex or put anything in your vagina until your pain goes away completely.  Get plenty of rest until your pain improves.  Drink enough fluid to keep your urine pale yellow.  Take over-the-counter and prescription medicines only as told by your health care provider.  Keep all follow-up visits as told by your health care provider. This is important. Contact a health care provider if:  Your pain continues or gets worse after resting.  You have lower abdominal pain that: ? Comes and goes at regular intervals. ? Spreads to your back. ? Is similar to menstrual cramps.  You have pain or burning when you urinate. Get help right away if:  You have a fever or chills.  You have vaginal bleeding.  You are leaking fluid from your vagina.  You are passing tissue from your vagina.  You have vomiting or diarrhea that lasts for more than 24 hours.  Your baby is moving less than usual.  You feel very weak or faint.  You have shortness of breath.  You develop severe pain in your upper abdomen. Summary  Abdominal pain is common during pregnancy, and has many possible causes.  If you experience abdominal pain during pregnancy, tell your health care provider right away.  Follow your health care provider's home care instructions and keep all follow-up visits as directed. This information is not intended to replace advice given to you by your health care provider. Make sure you discuss any questions you have with your health care  provider. Document Released: 12/18/2004 Document Revised: 04/07/2018 Document Reviewed: 03/22/2016 Elsevier Patient Education  [place] When a woman feels excessive tension or worry (anxiety) during pregnancy or during the first 12 months after she gives birth, she has a condition called perinatal anxiety. Anxiety can interfere with work, school, relationships, and other everyday activities. If it is not managed properly, it can also cause problems in the mother and her baby.  If you are pregnant and you have symptoms of an anxiety disorder, it is important to talk with your health care provider. What are the causes? The exact cause of this condition is not known. Hormonal changes during and after pregnancy may play a role in causing perinatal anxiety. What increases the risk? You are more likely to develop this condition if:  You have a personal or family history of depression, anxiety, or mood disorders.  You experience a stressful life event during pregnancy, such as the death of a loved one.  You have a lot of regular life stress, such as being a single parent.  You have thyroid problems. What are the signs or symptoms? Perinatal anxiety can be different for everyone. It may include:  Panic attacks (panic disorder). These are intense episodes of fear or discomfort that may also cause sweating, nausea, shortness of breath, or fear of dying. They usually last 5-15 minutes.  Reliving an upsetting (traumatic) event through distressing thoughts, dreams, or flashbacks (post-traumatic stress disorder, or PTSD).  Excessive worry about multiple problems (generalized anxiety disorder).  Fear and stress about leaving certain people or loved ones (separation anxiety).  Performing repetitive tasks (compulsions) to relieve stress or worry (obsessive compulsive disorder, or OCD).  Fear of certain objects or situations (phobias).  Excessive worrying, such as a  constant feeling that something bad is going to happen.  Inability to relax.  Difficulty concentrating.  Sleep problems.  Frequent nightmares or disturbing thoughts. How is this diagnosed? This condition is diagnosed based on a physical exam and mental evaluation. In some cases, your health care provider may use an anxiety screening tool. These tools include a list of questions that can help a health care provider diagnose anxiety. Your health care provider may refer you to a mental health expert who specializes in anxiety. How is this treated? This condition may be treated with:  Medicines. Your health care provider will only give you medicines that have been proven safe for pregnancy and breastfeeding.  Talk therapy with a mental health professional to help change your patterns of thinking (cognitive behavioral therapy).  Mindfulness-based stress reduction.  Other relaxation therapies, such as deep breathing or guided muscle relaxation.  Support groups. Follow these instructions at home: Lifestyle  Do not use any products that contain nicotine or tobacco, such as cigarettes and e-cigarettes. If you need help quitting, ask your health care provider.  Do not use alcohol when you are pregnant. After your baby is born, limit alcohol intake to no more than 1 drink a day. One drink equals 12 oz of beer, 5 oz of wine, or 1 oz of hard liquor.  Consider joining a support group for new mothers. Ask your health care provider for recommendations.  Take good care of yourself. Make sure you: ? Get plenty of sleep. If you are having trouble sleeping, talk with your health care provider. ? Eat a healthy diet. This includes plenty of fruits and vegetables, whole grains, and lean proteins. ? Exercise regularly, as told by your health care provider. Ask your health care provider what exercises are safe for you. General instructions  Take over-the-counter and prescription medicines only as told  by your health care provider.  Talk with your partner or family members about your feelings during pregnancy. Share any concerns or fears that you may have.  Ask for help with tasks or chores when you need it. Ask friends and family members to provide meals, watch your children, or help with cleaning.  Keep all follow-up visits as told by your health care provider. This is important. Contact a health care provider if:  You (or people close to you) notice that you have any symptoms of anxiety or depression.  You have anxiety and your symptoms get worse.  You experience side effects from medicines, such as nausea or sleep problems. Get help right away if:  You feel like hurting yourself, your baby, or someone else. If you ever feel like you may hurt yourself or others, or have thoughts about taking your own life, get help right away. You can go to your nearest emergency department or call:  Your local emergency services (911 in the [place]  A suicide crisis helpline, such as the National Suicide Prevention Lifeline at 712-845-9490. This is open 24 hours a day. Summary  Perinatal anxiety is when a woman feels excessive tension or worry during pregnancy or during the first 12 months after she gives birth.  Perinatal anxiety may include panic attacks, post-traumatic stress disorder, separation anxiety, phobias, or generalized anxiety.  Perinatal anxiety can cause  physical health problems in the mother and baby if not properly managed.  This condition is treated with medicines, talk therapy, stress reduction therapies, or a combination of two or more treatments.  Talk with your partner or family members about your concerns or fears. Do not be afraid to ask for help. This information is not intended to replace advice given to you by your health care provider. Make sure you discuss any questions you have with your health care provider. Document Released: 02/15/2016 Document Revised:  12/21/2016 Document Reviewed: 02/15/2016 Elsevier Patient Education  2020 [employer]  Perinatal Depression When a woman feels excessive sadness, anger, or anxiety during pregnancy or during the first 12 months after she gives birth, she has a condition called perinatal depression. Depression can interfere with work, school, relationships, and other everyday activities. If it is not managed properly, it can also cause problems in the mother and her baby. Sometimes, perinatal depression is left untreated because symptoms are thought to be normal mood swings during and right after pregnancy. If you have symptoms of depression, it is important to talk with your health care provider. What are the causes? The exact cause of this condition is not known. Hormonal changes during and after pregnancy may play a role in causing perinatal depression. What increases the risk? You are more likely to develop this condition if:  You have a personal or family history of depression, anxiety, or mood disorders.  You experience a stressful life event during pregnancy, such as the death of a loved one.  You have a lot of regular life stress.  You do not have support from family members or loved ones, or you are in an abusive relationship. What are the signs or symptoms? Symptoms of this condition include:  Feeling sad or hopeless.  Feelings of guilt.  Feeling irritable or overwhelmed.  Changes in your appetite.  Lack of energy or motivation.  Sleep problems.  Difficulty concentrating or completing tasks.  Loss of interest in hobbies or relationships.  Headaches or stomach problems that do not go away. How is this diagnosed? This condition is diagnosed based on a physical exam and mental evaluation. In some cases, your health care provider may use a depression screening tool. These tools include a list of questions that can help a health care provider diagnose depression. Your health care  provider may refer you to a mental health expert who specializes in depression. How is this treated? This condition may be treated with:  Medicines. Your health care provider will only give you medicines that have been proven safe for pregnancy and breastfeeding.  Talk therapy with a mental health professional to help change your patterns of thinking (cognitive behavioral therapy).  Support groups.  Brain stimulation or light therapies.  Stress reduction therapies, such as mindfulness. Follow these instructions at home: Lifestyle  Do not use any products that contain nicotine or tobacco, such as cigarettes and e-cigarettes. If you need help quitting, ask your health care provider.  Do not use alcohol when you are pregnant. After your baby is born, limit alcohol intake to no more than 1 drink a day. One drink equals 12 oz of beer, 5 oz of wine, or 1 oz of hard liquor.  Consider joining a support group for new mothers. Ask your health care provider for recommendations.  Take good care of yourself. Make sure you: ? Get plenty of sleep. If you are having trouble sleeping, talk with your health care provider. ? Eat  a healthy diet. This includes plenty of fruits and vegetables, whole grains, and lean proteins. ? Exercise regularly, as told by your health care provider. Ask your health care provider what exercises are safe for you. General instructions  Take over-the-counter and prescription medicines only as told by your health care provider.  Talk with your partner or family members about your feelings during pregnancy. Share any concerns or anxieties that you may have.  Ask for help with tasks or chores when you need it. Ask friends and family members to provide meals, watch your children, or help with cleaning.  Keep all follow-up visits as told by your health care provider. This is important. Contact a health care provider if:  You (or people close to you) notice that you have any  symptoms of depression.  You have depression and your symptoms get worse.  You experience side effects from medicines, such as nausea or sleep problems. Get help right away if:  You feel like hurting yourself, your baby, or someone else. If you ever feel like you may hurt yourself or others, or have thoughts about taking your own life, get help right away. You can go to your nearest emergency department or call:  Your local emergency services (911 in the [place]  A suicide crisis helpline, such as the National Suicide Prevention Lifeline at 605-031-65971-440-592-3518. This is open 24 hours a day. Summary  Perinatal depression is when a woman feels excessive sadness, anger, or anxiety during pregnancy or during the first 12 months after she gives birth.  If perinatal depression is not treated, it can lead to health problems for the mother and her baby.  This condition is treated with medicines, talk therapy, stress reduction therapies, or a combination of two or more treatments.  Talk with your partner or family members about your feelings. Do not be afraid to ask for help. This information is not intended to replace advice given to you by your health care provider. Make sure you discuss any questions you have with your health care provider. Document Released: 02/15/2016 Document Revised: 12/21/2016 Document Reviewed: 02/15/2016 Elsevier Patient Education  2020 [employer] Inc[employer]  Cannabinoid Hyperemesis Syndrome Cannabinoid hyperemesis syndrome (CHS) is a condition that causes repeated nausea, vomiting, and abdominal pain after long-term (chronic) use of marijuana (cannabis). People with CHS typically use marijuana 3-5 times a day for many years before they have symptoms, although it is possible to develop CHS with as little as 1 use per day. Symptoms of CHS may be mild at first but can get worse and more frequent. In some cases, CHS may cause vomiting many times a day, which can lead to weight loss and  dehydration. CHS may go away and come back many times (recur). People may not have symptoms or may otherwise be healthy in [facility]. What are the causes? The exact cause of this condition is not known. Long-term use of marijuana may over-stimulate certain proteins in the brain that react with chemicals in marijuana (cannabinoid receptors). This over-stimulation may cause CHS. What are the signs or symptoms? Symptoms of this condition are often mild during the first few attacks, but they can get worse over time. Symptoms may include:  Frequent nausea, especially early in the morning.  Vomiting.  Abdominal pain. Taking several hot showers throughout the day can also be a sign of this condition. People with CHS may do this because it relieves symptoms. How is this diagnosed? This condition may be diagnosed based on:  Your symptoms and medical history, including any drug use.  A physical exam. You may have tests done to rule out other problems. These tests may include:  Blood tests.  Urine tests.  Imaging tests, such as an X-ray or CT scan. How is this treated? Treatment for this condition involves stopping marijuana use. Your health care provider may recommend:  A drug rehabilitation program, if you have trouble stopping marijuana use.  Medicines for nausea.  Hot showers to help relieve symptoms. Certain creams that contain a substance called capsaicin may improve symptoms when applied to the abdomen. Ask your health care provider before starting any medicines or other treatments. Severe nausea and vomiting may require you to stay at the hospital. You may need IV fluids to prevent or treat dehydration. You may also need certain medicines that must be given at the hospital. Follow these instructions at home: During an attack   Stay in bed and rest in a dark, quiet room.  Take anti-nausea medicine as told by your health care provider.  Try taking hot showers to  relieve your symptoms. After an attack  Drink small amounts of clear fluids slowly. Gradually add more.  Once you are able to eat without vomiting, eat soft foods in small amounts every 3-4 hours. General instructions   Do not use any products that contain marijuana.If you need help quitting, ask your health care provider for resources and treatment options.  Drink enough fluid to keep your urine pale yellow. Avoid drinking fluids that have a lot of sugar or caffeine, such as coffee and soda.  Take and apply over-the-counter and prescription medicines only as told by your health care provider. Ask your health care provider before starting any new medicines or treatments.  Keep all follow-up visits as told by your health care provider. This is important. Contact a health care provider if:  Your symptoms get worse.  You cannot drink fluids without vomiting.  You have pain and trouble swallowing after an attack. Get help right away if:  You cannot stop vomiting.  You have blood in your vomit or your vomit looks like coffee grounds.  You have severe abdominal pain.  You have stools that are bloody or black, or stools that look like tar.  You have symptoms of dehydration, such as: ? Sunken eyes. ? Inability to make tears. ? Cracked lips. ? Dry mouth. ? Decreased urine production. ? Weakness. ? Sleepiness. ? Fainting. Summary  Cannabinoid hyperemesis syndrome (CHS) is a condition that causes repeated nausea, vomiting, and abdominal pain after long-term use of marijuana.  People with CHS typically use marijuana 3-5 times a day for many years before they have symptoms, although it is possible to develop CHS with as little as 1 use per day.  Treatment for this condition involves stopping marijuana use. Hot showers and capsaicin creams may also help relieve symptoms. Ask your health care provider before starting any medicines or other treatments.  Your health care provider may  prescribe medicines to help with nausea.  Get help right away if you have signs of dehydration, such as dry mouth, decreased urine production, or weakness. This information is not intended to replace advice given to you by your health care provider. Make sure you discuss any questions you have with your health care provider. Document Released: 03/28/2016 Document Revised: 04/26/2017 Document Reviewed: 03/28/2016 Elsevier Patient Education  2020 [employer].

## 2018-11-05 LAB — CULTURE, OB URINE: Culture: NO GROWTH

## 2018-11-06 ENCOUNTER — Encounter (HOSPITAL_COMMUNITY): Payer: Self-pay

## 2018-11-06 ENCOUNTER — Inpatient Hospital Stay (HOSPITAL_COMMUNITY)
Admission: AD | Admit: 2018-11-06 | Discharge: 2018-11-06 | Disposition: A | Discharge status: Home / Self Care | Payer: Medicaid Other | Attending: Obstetrics and Gynecology | Admitting: Obstetrics and Gynecology

## 2018-11-06 ENCOUNTER — Other Ambulatory Visit: Payer: Self-pay

## 2018-11-06 DIAGNOSIS — O26892 Other specified pregnancy related conditions, second trimester: Secondary | ICD-10-CM | POA: Insufficient documentation

## 2018-11-06 DIAGNOSIS — R109 Unspecified abdominal pain: Secondary | ICD-10-CM | POA: Insufficient documentation

## 2018-11-06 DIAGNOSIS — O219 Vomiting of pregnancy, unspecified: Secondary | ICD-10-CM | POA: Diagnosis not present

## 2018-11-06 DIAGNOSIS — Z3492 Encounter for supervision of normal pregnancy, unspecified, second trimester: Secondary | ICD-10-CM

## 2018-11-06 DIAGNOSIS — Z3A17 17 weeks gestation of pregnancy: Secondary | ICD-10-CM

## 2018-11-06 DIAGNOSIS — F12188 Cannabis abuse with other cannabis-induced disorder: Secondary | ICD-10-CM | POA: Diagnosis not present

## 2018-11-06 DIAGNOSIS — Z79899 Other long term (current) drug therapy: Secondary | ICD-10-CM | POA: Diagnosis not present

## 2018-11-06 DIAGNOSIS — O99322 Drug use complicating pregnancy, second trimester: Secondary | ICD-10-CM

## 2018-11-06 DIAGNOSIS — F1210 Cannabis abuse, uncomplicated: Secondary | ICD-10-CM

## 2018-11-06 HISTORY — DX: Unspecified abnormal cytological findings in specimens from vagina: R87.629

## 2018-11-06 HISTORY — DX: Depression, unspecified: F32.A

## 2018-11-06 LAB — URINALYSIS, ROUTINE W REFLEX MICROSCOPIC
Bilirubin Urine: NEGATIVE
Glucose, UA: NEGATIVE mg/dL
Hgb urine dipstick: NEGATIVE
Ketones, ur: NEGATIVE mg/dL
Leukocytes,Ua: NEGATIVE
Nitrite: NEGATIVE
Protein, ur: NEGATIVE mg/dL
Specific Gravity, Urine: 1.017 (ref 1.005–1.030)
pH: 7 (ref 5.0–8.0)

## 2018-11-06 MED ORDER — HALOPERIDOL LACTATE 5 MG/ML IJ SOLN
5.0000 mg | Freq: Four times a day (QID) | INTRAMUSCULAR | Status: DC | PRN
  Administered 2018-11-06: 5 mg via INTRAVENOUS
  Filled 2018-11-06 (×2): qty 1

## 2018-11-06 MED ORDER — LACTATED RINGERS IV BOLUS
1000.0000 mL | Freq: Once | INTRAVENOUS | Status: AC
  Administered 2018-11-06: 10:00:00 1000 mL via INTRAVENOUS

## 2018-11-06 MED ORDER — DIPHENHYDRAMINE HCL 50 MG/ML IJ SOLN
50.0000 mg | Freq: Once | INTRAMUSCULAR | Status: AC
  Administered 2018-11-06: 10:00:00 50 mg via INTRAVENOUS
  Filled 2018-11-06: qty 1

## 2018-11-06 MED ORDER — GLYCOPYRROLATE 2 MG PO TABS: 2.0000 mg | ORAL_TABLET | Freq: Three times a day (TID) | ORAL | 3 refills | Status: DC | PRN

## 2018-11-06 NOTE — MAU Note (Addendum)
Pt states she began vomiting on Monday, denies fever or diarrhea.  States she has vomited approx 15x last 24hrs. States she has been prescribed Phenergan & last took 25mg  at 0430; states med only makes her drowsy.  States she has not been able to eat, but is able to drink water.  States she has felt this type of pain before, but it has never been this severe or lasted this long.  Pt states she has had constant "squeezing behind my belly button" since last week. Denies UTI symptoms, no flank pain. Denies VB or LOF.

## 2018-11-06 NOTE — Discharge Instructions (Signed)
Cannabinoid Hyperemesis Syndrome °Cannabinoid hyperemesis syndrome (CHS) is a condition that causes repeated nausea, vomiting, and abdominal pain after long-term (chronic) use of marijuana (cannabis). People with CHS typically use marijuana 3-5 times a day for many years before they have symptoms, although it is possible to develop CHS with as little as 1 use per day. °Symptoms of CHS may be mild at first but can get worse and more frequent. In some cases, CHS may cause vomiting many times a day, which can lead to weight loss and dehydration. CHS may go away and come back many times (recur). People may not have symptoms or may otherwise be healthy in between CHS attacks. °What are the causes? °The exact cause of this condition is not known. Long-term use of marijuana may over-stimulate certain proteins in the brain that react with chemicals in marijuana (cannabinoid receptors). This over-stimulation may cause CHS. °What are the signs or symptoms? °Symptoms of this condition are often mild during the first few attacks, but they can get worse over time. Symptoms may include: °· Frequent nausea, especially early in the morning. °· Vomiting. °· Abdominal pain. °Taking several hot showers throughout the day can also be a sign of this condition. People with CHS may do this because it relieves symptoms. °How is this diagnosed? °This condition may be diagnosed based on: °· Your symptoms and medical history, including any drug use. °· A physical exam. °You may have tests done to rule out other problems. These tests may include: °· Blood tests. °· Urine tests. °· Imaging tests, such as an X-ray or CT scan. °How is this treated? °Treatment for this condition involves stopping marijuana use. Your health care provider may recommend: °· A drug rehabilitation program, if you have trouble stopping marijuana use. °· Medicines for nausea. °· Hot showers to help relieve symptoms. °Certain creams that contain a substance called  capsaicin may improve symptoms when applied to the abdomen. Ask your health care provider before starting any medicines or other treatments. °Severe nausea and vomiting may require you to stay at the hospital. You may need IV fluids to prevent or treat dehydration. You may also need certain medicines that must be given at the hospital. °Follow these instructions at home: °During an attack ° °· Stay in bed and rest in a dark, quiet room. °· Take anti-nausea medicine as told by your health care provider. °· Try taking hot showers to relieve your symptoms. °After an attack °· Drink small amounts of clear fluids slowly. Gradually add more. °· Once you are able to eat without vomiting, eat soft foods in small amounts every 3-4 hours. °General instructions ° °· Do not use any products that contain marijuana.If you need help quitting, ask your health care provider for resources and treatment options. °· Drink enough fluid to keep your urine pale yellow. Avoid drinking fluids that have a lot of sugar or caffeine, such as coffee and soda. °· Take and apply over-the-counter and prescription medicines only as told by your health care provider. Ask your health care provider before starting any new medicines or treatments. °· Keep all follow-up visits as told by your health care provider. This is important. °Contact a health care provider if: °· Your symptoms get worse. °· You cannot drink fluids without vomiting. °· You have pain and trouble swallowing after an attack. °Get help right away if: °· You cannot stop vomiting. °· You have blood in your vomit or your vomit looks like coffee grounds. °· You have   severe abdominal pain. °· You have stools that are bloody or black, or stools that look like tar. °· You have symptoms of dehydration, such as: °? Sunken eyes. °? Inability to make tears. °? Cracked lips. °? Dry mouth. °? Decreased urine production. °? Weakness. °? Sleepiness. °? Fainting. °Summary °· Cannabinoid hyperemesis  syndrome (CHS) is a condition that causes repeated nausea, vomiting, and abdominal pain after long-term use of marijuana. °· People with CHS typically use marijuana 3-5 times a day for many years before they have symptoms, although it is possible to develop CHS with as little as 1 use per day. °· Treatment for this condition involves stopping marijuana use. Hot showers and capsaicin creams may also help relieve symptoms. Ask your health care provider before starting any medicines or other treatments. °· Your health care provider may prescribe medicines to help with nausea. °· Get help right away if you have signs of dehydration, such as dry mouth, decreased urine production, or weakness. °This information is not intended to replace advice given to you by your health care provider. Make sure you discuss any questions you have with your health care provider. °Document Released: 03/28/2016 Document Revised: 04/26/2017 Document Reviewed: 03/28/2016 °Elsevier Patient Education © 2020 [employer] Inc[employer] ° °

## 2018-11-06 NOTE — MAU Provider Note (Signed)
History     CSN: [id number]  Arrival date and time: 11/06/18 0998   First Provider Initiated Contact with Patient 11/06/18 0930      Chief Complaint  Patient presents with  . Emesis  . Abdominal Pain   HPI Debra Palmer is a 33 y.o. [id number] at [redacted]w[redacted]d who presents to MAU with chief complaint of abdominal pain, new onset last week. She denies vaginal bleeding, abdominal pain, abnormal vaginal discharge, decreased fetal movement, fever, falls, or recent illness.   Patient also complains of recurrent nausea and vomiting. Patient states she can tolerate sips of water without difficulty. She attempted a peanut butter sandwich around 0430 this morning but was unable to keep it down.    Patient endorses history of marijuana use but states she has not smoked in the past month. She estimates she smoked three times per day during her active phase.  Patient was seen for identical complaints on 11/03, left AMA prior to offered GI workup.  OB History    Gravida  4   Para  2   Term  2   Preterm      AB  1   Living  2     SAB  1   TAB      Ectopic      Multiple  0   Live Births  2           Past Medical History:  Diagnosis Date  . Depression   . Panic attack   . Vaginal Pap smear, abnormal     Past Surgical History:  Procedure Laterality Date  . ABDOMINAL SURGERY    . FACIAL FRACTURE SURGERY      Family History  Problem Relation Age of Onset  . Alcohol abuse Neg Hx   . Arthritis Neg Hx   . Asthma Neg Hx   . Birth defects Neg Hx   . Cancer Neg Hx   . COPD Neg Hx   . Depression Neg Hx   . Diabetes Neg Hx   . Drug abuse Neg Hx   . Early death Neg Hx   . Hearing loss Neg Hx   . Heart disease Neg Hx   . Hyperlipidemia Neg Hx   . Hypertension Neg Hx   . Kidney disease Neg Hx   . Learning disabilities Neg Hx   . Mental illness Neg Hx   . Mental retardation Neg Hx   . Miscarriages / Stillbirths Neg Hx   . Stroke Neg Hx   . Vision loss Neg Hx   .  Varicose Veins Neg Hx     Social History   Tobacco Use  . Smoking status: Never Smoker  . Smokeless tobacco: Never Used  Substance Use Topics  . Alcohol use: No  . Drug use: No    Allergies: No Known Allergies  Medications Prior to Admission  Medication Sig Dispense Refill Last Dose  . lansoprazole (PREVACID) 15 MG capsule Take 15 mg by mouth 2 (two) times daily before a meal.     . promethazine (PHENERGAN) 25 MG tablet Take 1 tablet (25 mg total) by mouth every 6 (six) hours as needed for nausea or vomiting. 30 tablet 0   . sucralfate (CARAFATE) 1 g tablet Take 1 tablet (1 g total) by mouth 4 (four) times daily -  with meals and at bedtime. 90 tablet 0     Review of Systems  Gastrointestinal: Positive for abdominal pain, nausea and vomiting.  Musculoskeletal: Negative  for back pain.  All other systems reviewed and are negative.  Physical Exam   Blood pressure 114/71, pulse 81, temperature 98.1 F (36.7 C), temperature source Oral, resp. rate 18, last menstrual period 07/05/2018, SpO2 98 %, unknown if currently breastfeeding.  Physical Exam  Nursing note and vitals reviewed. Constitutional: She is oriented to person, place, and time. She appears well-developed and well-nourished.  Cardiovascular: Normal rate.  Respiratory: Effort normal and breath sounds normal.  GI: Soft. Bowel sounds are normal. She exhibits no distension. There is no abdominal tenderness. There is no rebound, no guarding and no CVA tenderness.  Neurological: She is oriented to person, place, and time.  Skin: Skin is warm and dry.  Psychiatric: She has a normal mood and affect. Her behavior is normal. Judgment and thought content normal.    MAU Course/MDM  Procedures  --Patient declined to provide urine sample throughout time in MAU until discharge.Treat for cannabis hyperemesis based on + THC during MAU visit two days ago --Discussed slow reintroduction of bland solid food vs patient's attempt to eat  peanut butter sandwich. --Patient spitting continuously but no emesis at any time during MAU visit. Rx Robinul in addition to previously prescribed medications  Patient Vitals for the past 24 hrs:  BP Temp Temp src Pulse Resp SpO2  11/06/18 1105 (!) 81/45 98.7 F (37.1 C) Oral 77 16 -  11/06/18 0925 114/71 98.1 F (36.7 C) Oral 81 18 98 %   Results for orders placed or performed during the hospital encounter of 11/06/18 (from the past 24 hour(s))  Urinalysis, Routine w reflex microscopic     Status: Abnormal   Collection Time: 11/06/18 10:52 AM  Result Value Ref Range   Color, Urine AMBER (A) YELLOW   APPearance CLEAR CLEAR   Specific Gravity, Urine 1.017 1.005 - 1.030   pH 7.0 5.0 - 8.0   Glucose, UA NEGATIVE NEGATIVE mg/dL   Hgb urine dipstick NEGATIVE NEGATIVE   Bilirubin Urine NEGATIVE NEGATIVE   Ketones, ur NEGATIVE NEGATIVE mg/dL   Protein, ur NEGATIVE NEGATIVE mg/dL   Nitrite NEGATIVE NEGATIVE   Leukocytes,Ua NEGATIVE NEGATIVE   Meds ordered this encounter  Medications  . diphenhydrAMINE (BENADRYL) injection 50 mg  . haloperidol lactate (HALDOL) injection 5 mg  . lactated ringers bolus 1,000 mL  . glycopyrrolate (ROBINUL) 2 MG tablet    Sig: Take 1 tablet (2 mg total) by mouth 3 (three) times daily as needed.    Dispense:  30 tablet    Refill:  3    Order Specific Question:   Supervising Provider    Answer:   Adam Phenix [[id number]]    Assessment and Plan  --33 y.o. [phone] at [redacted]w[redacted]d  --FHT 141 by Doppler --Concern for Cannabis Hyperemesis, pt denies recent use --Tolerating PO, denies pain prior to discharge --Discharge home in stable condition  F/U: --OB appt 11/25/18 with [facility]   Calvert Cantor, CNM 11/06/2018, 3:14 PM

## 2018-11-08 ENCOUNTER — Inpatient Hospital Stay (HOSPITAL_COMMUNITY)
Admission: AD | Admit: 2018-11-08 | Discharge: 2018-11-08 | Disposition: A | Discharge status: Home / Self Care | Payer: Medicaid Other | Attending: Obstetrics and Gynecology | Admitting: Obstetrics and Gynecology

## 2018-11-08 ENCOUNTER — Inpatient Hospital Stay (HOSPITAL_COMMUNITY): Payer: Medicaid Other

## 2018-11-08 ENCOUNTER — Other Ambulatory Visit: Payer: Self-pay

## 2018-11-08 ENCOUNTER — Encounter (HOSPITAL_COMMUNITY): Payer: Self-pay | Admitting: *Deleted

## 2018-11-08 DIAGNOSIS — O26892 Other specified pregnancy related conditions, second trimester: Secondary | ICD-10-CM | POA: Diagnosis not present

## 2018-11-08 DIAGNOSIS — O219 Vomiting of pregnancy, unspecified: Secondary | ICD-10-CM

## 2018-11-08 DIAGNOSIS — F12188 Cannabis abuse with other cannabis-induced disorder: Secondary | ICD-10-CM | POA: Insufficient documentation

## 2018-11-08 DIAGNOSIS — F1290 Cannabis use, unspecified, uncomplicated: Secondary | ICD-10-CM

## 2018-11-08 DIAGNOSIS — O99322 Drug use complicating pregnancy, second trimester: Secondary | ICD-10-CM | POA: Insufficient documentation

## 2018-11-08 DIAGNOSIS — Z3A18 18 weeks gestation of pregnancy: Secondary | ICD-10-CM | POA: Insufficient documentation

## 2018-11-08 DIAGNOSIS — R109 Unspecified abdominal pain: Secondary | ICD-10-CM

## 2018-11-08 LAB — URINALYSIS, ROUTINE W REFLEX MICROSCOPIC
Bilirubin Urine: NEGATIVE
Glucose, UA: NEGATIVE mg/dL
Hgb urine dipstick: NEGATIVE
Ketones, ur: NEGATIVE mg/dL
Leukocytes,Ua: NEGATIVE
Nitrite: NEGATIVE
Protein, ur: NEGATIVE mg/dL
Specific Gravity, Urine: 1.018 (ref 1.005–1.030)
pH: 6 (ref 5.0–8.0)

## 2018-11-08 LAB — COMPREHENSIVE METABOLIC PANEL
ALT: 197 U/L — ABNORMAL HIGH (ref 0–44)
AST: 107 U/L — ABNORMAL HIGH (ref 15–41)
Albumin: 3.4 g/dL — ABNORMAL LOW (ref 3.5–5.0)
Alkaline Phosphatase: 99 U/L (ref 38–126)
Anion gap: 12 (ref 5–15)
BUN: 11 mg/dL (ref 6–20)
CO2: 27 mmol/L (ref 22–32)
Calcium: 9.3 mg/dL (ref 8.9–10.3)
Chloride: 93 mmol/L — ABNORMAL LOW (ref 98–111)
Creatinine, Ser: 0.68 mg/dL (ref 0.44–1.00)
GFR calc Af Amer: >60 mL/min (ref >60)
GFR calc non Af Amer: >60 mL/min (ref >60)
Glucose, Bld: 90 mg/dL (ref 70–99)
Potassium: 2.8 mmol/L — ABNORMAL LOW (ref 3.5–5.1)
Sodium: 132 mmol/L — ABNORMAL LOW (ref 135–145)
Total Bilirubin: 0.8 mg/dL (ref 0.3–1.2)
Total Protein: 7.2 g/dL (ref 6.5–8.1)

## 2018-11-08 LAB — CBC WITH DIFFERENTIAL/PLATELET
Abs Immature Granulocytes: 0.08 10*3/uL — ABNORMAL HIGH (ref 0.00–0.07)
Basophils Absolute: 0 10*3/uL (ref 0.0–0.1)
Basophils Relative: 0 %
Eosinophils Absolute: 0 10*3/uL (ref 0.0–0.5)
Eosinophils Relative: 0 %
HCT: 39.7 % (ref 36.0–46.0)
Hemoglobin: 13.8 g/dL (ref 12.0–15.0)
Immature Granulocytes: 1 %
Lymphocytes Relative: 12 %
Lymphs Abs: 1.5 10*3/uL (ref 0.7–4.0)
MCH: 32.2 pg (ref 26.0–34.0)
MCHC: 34.8 g/dL (ref 30.0–36.0)
MCV: 92.8 fL (ref 80.0–100.0)
Monocytes Absolute: 0.7 10*3/uL (ref 0.1–1.0)
Monocytes Relative: 6 %
Neutro Abs: 9.9 10*3/uL — ABNORMAL HIGH (ref 1.7–7.7)
Neutrophils Relative %: 81 %
Platelets: 242 10*3/uL (ref 150–400)
RBC: 4.28 MIL/uL (ref 3.87–5.11)
RDW: 11.8 % (ref 11.5–15.5)
WBC: 12.3 10*3/uL — ABNORMAL HIGH (ref 4.0–10.5)
nRBC: 0 % (ref 0.0–0.2)

## 2018-11-08 LAB — HEPATITIS PANEL, ACUTE
HCV Ab: NONREACTIVE
Hep A IgM: NONREACTIVE
Hep B C IgM: NONREACTIVE
Hepatitis B Surface Ag: NONREACTIVE

## 2018-11-08 LAB — AMYLASE: Amylase: 69 U/L (ref 28–100)

## 2018-11-08 LAB — LIPASE, BLOOD: Lipase: 24 U/L (ref 11–51)

## 2018-11-08 MED ORDER — DIPHENHYDRAMINE HCL 50 MG/ML IJ SOLN
12.5000 mg | Freq: Once | INTRAMUSCULAR | Status: AC
  Administered 2018-11-08: 12.5 mg via INTRAVENOUS
  Filled 2018-11-08: qty 1

## 2018-11-08 MED ORDER — FAMOTIDINE IN NACL 20-0.9 MG/50ML-% IV SOLN
20.0000 mg | Freq: Once | INTRAVENOUS | Status: AC
  Administered 2018-11-08: 20 mg via INTRAVENOUS
  Filled 2018-11-08: qty 50

## 2018-11-08 MED ORDER — HALOPERIDOL LACTATE 5 MG/ML IJ SOLN
5.0000 mg | Freq: Four times a day (QID) | INTRAMUSCULAR | Status: DC | PRN
  Administered 2018-11-08: 15:00:00 5 mg via INTRAVENOUS
  Filled 2018-11-08 (×3): qty 1

## 2018-11-08 MED ORDER — LACTATED RINGERS IV SOLN
INTRAVENOUS | Status: DC
  Administered 2018-11-08: 09:00:00 via INTRAVENOUS

## 2018-11-08 MED ORDER — CAPSAICIN 0.025 % EX CREA: TOPICAL_CREAM | Freq: Two times a day (BID) | CUTANEOUS | 0 refills | Status: DC

## 2018-11-08 MED ORDER — PROMETHAZINE HCL 25 MG/ML IJ SOLN
12.5000 mg | Freq: Once | INTRAMUSCULAR | Status: AC
  Administered 2018-11-08: 12.5 mg via INTRAVENOUS
  Filled 2018-11-08: qty 1

## 2018-11-08 MED ORDER — POTASSIUM CHLORIDE CRYS ER 20 MEQ PO TBCR
40.0000 meq | EXTENDED_RELEASE_TABLET | Freq: Once | ORAL | Status: AC
  Administered 2018-11-08: 40 meq via ORAL
  Filled 2018-11-08: qty 2

## 2018-11-08 MED ORDER — ONDANSETRON HCL 4 MG/2ML IJ SOLN
4.0000 mg | Freq: Once | INTRAMUSCULAR | Status: AC
  Administered 2018-11-08: 4 mg via INTRAVENOUS
  Filled 2018-11-08: qty 2

## 2018-11-08 NOTE — MAU Provider Note (Signed)
History     CSN: [id number]  Arrival date and time: 11/08/18 [id number]   First Provider Initiated Contact with Patient 11/08/18 [phone]      Chief Complaint  Patient presents with  . Abdominal Pain  . Emesis   Debra KnockJacqueline Odonoghue is a 33 y.o. G4P2 at 18w today who presents to MAU with complaints of abdominal pain. She reports abdominal pain has been occurring for 2 weeks. She reports the pain occurs in wave, "stomach is tearing apart wave", sharp and stabbing. That gets worse 5-10 minutes after she wakes up or 5-10 minutes after she eats. Sitting straight up in bed sometimes helps the pain. Rates pain 10/10- has taken Phenergan and Tylenol around 0430 without relief. She reports she had H Pylori in the past (2012) and these symptoms are similar when she had that. She denies being on routine medication such as pepcid or nexium- last ate around 0430 (cereal). Abdominal pain is assoicated with nausea and vomiting. Abdominal pain is specific to periumbilicus area- denies radiation.   Denies vaginal bleeding or discharge- no other pregnancy concerns. Patient reports last use of marijuana was a month ago.   OB History    Gravida  4   Para  2   Term  2   Preterm      AB  1   Living  2     SAB  1   TAB      Ectopic      Multiple  0   Live Births  2           Past Medical History:  Diagnosis Date  . Depression   . Panic attack   . Vaginal Pap smear, abnormal     Past Surgical History:  Procedure Laterality Date  . ABDOMINAL SURGERY    . FACIAL FRACTURE SURGERY      Family History  Problem Relation Age of Onset  . Alcohol abuse Neg Hx   . Arthritis Neg Hx   . Asthma Neg Hx   . Birth defects Neg Hx   . Cancer Neg Hx   . COPD Neg Hx   . Depression Neg Hx   . Diabetes Neg Hx   . Drug abuse Neg Hx   . Early death Neg Hx   . Hearing loss Neg Hx   . Heart disease Neg Hx   . Hyperlipidemia Neg Hx   . Hypertension Neg Hx   . Kidney disease Neg Hx   . Learning  disabilities Neg Hx   . Mental illness Neg Hx   . Mental retardation Neg Hx   . Miscarriages / Stillbirths Neg Hx   . Stroke Neg Hx   . Vision loss Neg Hx   . Varicose Veins Neg Hx     Social History   Tobacco Use  . Smoking status: Never Smoker  . Smokeless tobacco: Never Used  Substance Use Topics  . Alcohol use: No  . Drug use: No    Allergies: No Known Allergies  Medications Prior to Admission  Medication Sig Dispense Refill Last Dose  . glycopyrrolate (ROBINUL) 2 MG tablet Take 1 tablet (2 mg total) by mouth 3 (three) times daily as needed. 30 tablet 3   . lansoprazole (PREVACID) 15 MG capsule Take 15 mg by mouth 2 (two) times daily before a meal.     . promethazine (PHENERGAN) 25 MG tablet Take 1 tablet (25 mg total) by mouth every 6 (six) hours as needed for nausea  or vomiting. 30 tablet 0   . sucralfate (CARAFATE) 1 g tablet Take 1 tablet (1 g total) by mouth 4 (four) times daily -  with meals and at bedtime. 90 tablet 0     Review of Systems  Constitutional: Negative.   Respiratory: Negative.   Cardiovascular: Negative.   Gastrointestinal: Positive for abdominal pain, nausea and vomiting. Negative for constipation and diarrhea.  Genitourinary: Negative.   Musculoskeletal: Negative.   Neurological: Negative.    Physical Exam   Blood pressure 112/72, pulse 79, temperature 97.7 F (36.5 C), temperature source Oral, resp. rate 16, last menstrual period 07/05/2018, SpO2 98 %, unknown if currently breastfeeding.  Physical Exam  Nursing note and vitals reviewed. Constitutional: She is oriented to person, place, and time. She appears well-developed and well-nourished. She appears distressed.  Cardiovascular: Normal rate and regular rhythm.  Respiratory: Effort normal and breath sounds normal. No respiratory distress. She has no wheezes.  GI: Soft. She exhibits no distension. There is abdominal tenderness in the right upper quadrant, epigastric area and periumbilical  area. There is guarding. There is no rebound and no CVA tenderness. No hernia.  Tenderness in RUQ, peri umbilicus and superior to umbilicus    Genitourinary:    Genitourinary Comments: deferred   Musculoskeletal: Normal range of motion.        General: No edema.  Neurological: She is alert and oriented to person, place, and time.  Psychiatric: She has a normal mood and affect. Her behavior is normal. Thought content normal.   FHR 134 by doppler   MAU Course  Procedures  MDM Orders Placed This Encounter  Procedures  . US Abdomen Limited RUQ  . US Abdomen Complete  . Urinalysis, Routine w reflex microscopic  . CBC with Differential  . Lipase, blood  . Amylase  . Comprehensive metabolic panel  . Bile acids, total  . H. pylori antibody, IgG  . Insert peripheral IV   Meds ordered this encounter  Medications  . lactated ringers infusion  . famotidine (PEPCID) IVPB 20 mg premix  . ondansetron (ZOFRAN) injection 4 mg  . potassium chloride SA (KLOR-CON) CR tablet 40 mEq  . promethazine (PHENERGAN) injection 12.5 mg  . diphenhydrAMINE (BENADRYL) injection 12.5 mg  . haloperidol lactate (HALDOL) injection 5 mg   [place] pending- Pepcid and zofran ordered   Labs and [place] report reviewed:  Results for orders placed or performed during the hospital encounter of 11/08/18 (from the past 24 hour(s))  Urinalysis, Routine w reflex microscopic     Status: None   Collection Time: 11/08/18  8:35 AM  Result Value Ref Range   Color, Urine YELLOW YELLOW   APPearance CLEAR CLEAR   Specific Gravity, Urine 1.018 1.005 - 1.030   pH 6.0 5.0 - 8.0   Glucose, UA NEGATIVE NEGATIVE mg/dL   Hgb urine dipstick NEGATIVE NEGATIVE   Bilirubin Urine NEGATIVE NEGATIVE   Ketones, ur NEGATIVE NEGATIVE mg/dL   Protein, ur NEGATIVE NEGATIVE mg/dL   Nitrite NEGATIVE NEGATIVE   Leukocytes,Ua NEGATIVE NEGATIVE  CBC with Differential     Status: Abnormal   Collection Time: 11/08/18  8:36 AM  Result Value Ref Range    WBC 12.3 (H) 4.0 - 10.5 K/uL   RBC 4.28 3.87 - 5.11 MIL/uL   Hemoglobin 13.8 12.0 - 15.0 g/dL   HCT 39.7 36.0 - 46.0 %   MCV 92.8 80.0 - 100.0 fL   MCH 32.2 26.0 - 34.0 pg   MCHC 34.8 30.0 -  36.0 g/dL   RDW 31.5 17.6 - 16.0 %   Platelets 242 150 - 400 K/uL   nRBC 0.0 0.0 - 0.2 %   Neutrophils Relative % 81 %   Neutro Abs 9.9 (H) 1.7 - 7.7 K/uL   Lymphocytes Relative 12 %   Lymphs Abs 1.5 0.7 - 4.0 K/uL   Monocytes Relative 6 %   Monocytes Absolute 0.7 0.1 - 1.0 K/uL   Eosinophils Relative 0 %   Eosinophils Absolute 0.0 0.0 - 0.5 K/uL   Basophils Relative 0 %   Basophils Absolute 0.0 0.0 - 0.1 K/uL   Immature Granulocytes 1 %   Abs Immature Granulocytes 0.08 (H) 0.00 - 0.07 K/uL  Lipase, blood     Status: None   Collection Time: 11/08/18  8:36 AM  Result Value Ref Range   Lipase 24 11 - 51 U/L  Amylase     Status: None   Collection Time: 11/08/18  8:36 AM  Result Value Ref Range   Amylase 69 28 - 100 U/L  Comprehensive metabolic panel     Status: Abnormal   Collection Time: 11/08/18  8:36 AM  Result Value Ref Range   Sodium 132 (L) 135 - 145 mmol/L   Potassium 2.8 (L) 3.5 - 5.1 mmol/L   Chloride 93 (L) 98 - 111 mmol/L   CO2 27 22 - 32 mmol/L   Glucose, Bld 90 70 - 99 mg/dL   BUN 11 6 - 20 mg/dL   Creatinine, Ser 7.37 0.44 - 1.00 mg/dL   Calcium 9.3 8.9 - 10.6 mg/dL   Total Protein 7.2 6.5 - 8.1 g/dL   Albumin 3.4 (L) 3.5 - 5.0 g/dL   AST 269 (H) 15 - 41 U/L   ALT 197 (H) 0 - 44 U/L   Alkaline Phosphatase 99 38 - 126 U/L   Total Bilirubin 0.8 0.3 - 1.2 mg/dL   GFR calc non Af Amer >60 >60 mL/min   GFR calc Af Amer >60 >60 mL/min   Anion gap 12 5 - 15   US Abdomen Complete  Result Date: 11/08/2018 CLINICAL DATA:  Abdominal pain, nausea vomiting, [redacted] weeks pregnant EXAM: ABDOMEN ULTRASOUND COMPLETE COMPARISON:  CT abdomen pelvis 04/09/2010 she FINDINGS: Gallbladder: No gallstones or wall thickening visualized. No sonographic Murphy sign noted by sonographer. Common  bile duct: Diameter: 0.2 cm Liver: No focal lesion identified. Within normal limits in parenchymal echogenicity. Portal vein is patent on color Doppler imaging with normal direction of blood flow towards the liver. IVC: No abnormality visualized. Pancreas: Visualized portion unremarkable. Spleen: Size and appearance within normal limits. Right Kidney: Length: 10.6 cm. Suggestion of very mild right hydronephrosis. Echogenicity within normal limits. No mass visualized. Left Kidney: Length: 11.4 cm. Echogenicity within normal limits. No mass or hydronephrosis visualized. Abdominal aorta: No aneurysm visualized. Other findings: None. IMPRESSION: 1.  No acute finding to explain the patient's abdominal pain. 2. Suggestion of a very mild right hydronephrosis which may be related to the patient's pregnancy. Electronically Signed   By: Emmaline Kluver M.D.   On: 11/08/2018 11:16   1106: Hepatitis panel ordered d/t elevated AST and ALT - consult with Dr Earlene Plater who agrees with plan of care  1130: Potassium ordered for hypokalemia of 2.8 Plan to reassess patient's pain in 30-40 minutes   Patient reports pain and nausea is not resolved or better after pepcid and zofran- Phenergan and benadryl ordered.  1217: Phenergan and benadryl given- reassess patient in 30-40 minutes  Upon  reassessment patient reports pain and nausea is still present and she is not able to taken potassium pill or eat crackers- Haldol ordered   1324- Haldol given  Upon reassessment 40 minutes later patient reports pain is resolved and she is able to eat crackers and juice in MAU without nausea/vomiting or abdominal pain.   Educated and discussed with patient that symptoms are related to marijuana use d/t haldol treating symptoms. Patient adamant that she has not used marijuana in 1 month. Encouraged her to continue to abstain from use but can have continued residual symptoms because of past daily use - patient verbalizes understanding. Educated  and discussed use of capsaicin at home for symptoms. Rx for capsaicin sent to pharmacy and discussed how to use medication - patient verbalizes understanding.  Hepatitis panel resulted as negative prior to discharge home- need to recheck liver enzymes in 2 weeks.   Discussed reasons to return to MAU. Follow up as scheduled in the office. Return to MAU as needed. Pt stable at time of discharge.   Assessment and Plan   1. Cannabinoid hyperemesis syndrome   2. Acute abdominal pain   3. [redacted] weeks gestation of pregnancy    Discharge home Follow up as scheduled in the office for prenatal care Recheck liver enzymes in 2 -3 weeks  Rx for Capsaicin sent to pharmacy of choice  Continue medication as prescribed  Return to MAU as needed for reasons discussed and/or emergencies   Follow-up Information    [facility]. Go on 11/25/2018.   Why: Follow up as scheduled for prenatal care  Contact information: [address] [place] [address] [phone]         Allergies as of 11/08/2018   No Known Allergies     Medication List    TAKE these medications   capsaicin 0.025 % cream Commonly known as: ZOSTRIX Apply topically 2 (two) times daily.   glycopyrrolate 2 MG tablet Commonly known as: ROBINUL Take 1 tablet (2 mg total) by mouth 3 (three) times daily as needed.   lansoprazole 15 MG capsule Commonly known as: PREVACID Take 15 mg by mouth 2 (two) times daily before a meal.   promethazine 25 MG tablet Commonly known as: PHENERGAN Take 1 tablet (25 mg total) by mouth every 6 (six) hours as needed for nausea or vomiting.   sucralfate 1 g tablet Commonly known as: Carafate Take 1 tablet (1 g total) by mouth 4 (four) times daily -  with meals and at bedtime.      Debra Palmer CNM 11/08/2018, 3:35 PM

## 2018-11-08 NOTE — MAU Note (Signed)
Debra Palmer is a 33 y.o. at [redacted]w[redacted]d here in MAU reporting:  +abdominal pain. Intermittent in waves. States she comes here and feels better but then when she wakes up from resting at home the pain is back. Denies recent marijuana use. States last use was a month ago.  States showers are no longer helping her symptoms +N/V ; 3 times today Onset of complaint: ongoing Pain score: 10/10 Last medication taken was phenergan this morning Vitals:   11/08/18 0735  BP: 112/72  Pulse: 79  Resp: 16  Temp: 97.7 F (36.5 C)  SpO2: 98%     FHT:134 via doppler Lab orders placed from triage: ua

## 2018-11-08 NOTE — Discharge Instructions (Signed)
Abdominal Pain During Pregnancy  Belly (abdominal) pain is common during pregnancy. There are many possible causes. Most of the time, it is not a serious problem. Other times, it can be a sign that something is wrong with the pregnancy. Always tell your doctor if you have belly pain. Follow these instructions at home:  Do not have sex or put anything in your vagina until your pain goes away completely.  Get plenty of rest until your pain gets better.  Drink enough fluid to keep your pee (urine) pale yellow.  Take over-the-counter and prescription medicines only as told by your doctor.  Keep all follow-up visits as told by your doctor. This is important. Contact a doctor if:  Your pain continues or gets worse after resting.  You have lower belly pain that: ? Comes and goes at regular times. ? Spreads to your back. ? Feels like menstrual cramps.  You have pain or burning when you pee (urinate). Get help right away if:  You have a fever or chills.  You have vaginal bleeding.  You are leaking fluid from your vagina.  You are passing tissue from your vagina.  You throw up (vomit) for more than 24 hours.  You have watery poop (diarrhea) for more than 24 hours.  Your baby is moving less than usual.  You feel very weak or faint.  You have shortness of breath.  You have very bad pain in your upper belly. Summary  Belly (abdominal) pain is common during pregnancy. There are many possible causes.  If you have belly pain during pregnancy, tell your doctor right away.  Keep all follow-up visits as told by your doctor. This is important. This information is not intended to replace advice given to you by your health care provider. Make sure you discuss any questions you have with your health care provider. Document Released: 12/06/2008 Document Revised: 04/07/2018 Document Reviewed: 03/22/2016 Elsevier Patient Education  2020 [employer].    Cannabis Use  Disorder Cannabis use disorder is when using marijuana disrupts a person's daily life or causes health problems. This condition can be dangerous. The health problems this condition can cause include:  Long-lasting problems with thinking and learning. These can be permanent in young people.  Severe anxiety.  Paranoia.  Hallucinations.  Dangerously high blood pressure and heart rate.  Schizophrenia.  Breathing problems.  Problems with child development during and after pregnancy. People with this condition are also more likely to use other drugs. What are the causes? This condition is caused by using marijuana too much over time. It is not caused by using it only once in a while. Many people with this condition use marijuana because it gives them a feeling of extreme pleasure or relaxation. What increases the risk? This condition is more likely to develop in:  Men.  People with a family history of cannabis use disorder.  People with mental health issues such as depression or post-traumatic stress disorder. What are the signs or symptoms? Symptoms of this condition include:  Using greater amounts of marijuana than you want to, or using marijuana for longer than you want to.  Craving marijuana.  Spending a lot of time getting marijuana and using it or recovering from its effects.  Having problems at work, at school, at home, or in relationships because of marijuana use.  Giving up or cutting down on important life activities because of marijuana use.  Using marijuana at times when it is dangerous, such as while you  are driving a car.  Needing more and more marijuana to get the same effect you want from the marijuana (building up a tolerance).  Physical problems, such as: ? A long-lasting cough. ? Bronchitis. ? Emphysema. ? Throat and lung cancer.  Mental problems, such as: ? Psychosis. ? Anxiety. ? Trouble sleeping.  Having symptoms of withdrawal when you stop  using marijuana. Symptom of withdrawal include: ? Irritability or anger. ? Anxiety or restlessness. ? Trouble sleeping. ? Loss of appetite or weight loss. ? Aches and pains. ? Shakiness, sweating, or chills. How is this diagnosed? This condition is diagnosed with an assessment. During the assessment, your health care provider will ask about your marijuana use and about how it affects your life. You will be diagnosed with the condition if you have had at least two symptoms of this condition within a 12-month period. How severe the condition is depends on how many symptoms you have.  If you have two to three symptoms, your condition is mild.  If you have four to five symptoms, your condition is moderate.  If you have six or more symptoms, your condition is severe. Your health care provider may perform a physical exam or do lab tests to see if you have physical problems resulting from marijuana use. Your health care provider may also screen for drug use and refer you to a mental health professional for evaluation. How is this treated? Treatment for this condition is usually provided by mental health professionals with training in substance use disorders. Your treatment may involve:  Counseling. This treatment is also called talk therapy. It is provided by substance use treatment counselors. A counselor can address the reasons you use marijuana and suggest ways to keep you from using it again. The goals of talk therapy are to: ? Find healthy activities to replace using marijuana. ? Identify and avoid the things that trigger your marijuana use. ? Help you learn how to handle cravings.  Support groups. Support groups are led by people who have quit using marijuana. They provide emotional support, advice, and guidance.  Medicine. Medicine is used to treat mental health issues that trigger marijuana use or that result from it. Follow these instructions at home:  Take over-the-counter and  prescription medicines only as told by your health care provider.  Check with your health care provider before starting any new medicines.  Keep all follow-up visits as told by your health care provider. This is important.  Work with Photographer or group to develop tools to keep you from using marijuana again (relapsing).  Make healthy lifestyle choices, such as: ? Eating a healthy diet. ? Getting enough exercise. ? Improving your stress-management skills.  Learn daily living skills and work Programmer, applications. Where to find more information  [employer] on Drug Abuse: http://www.price-smith.com/  Substance Abuse and Mental Health Services Administration: SkateOasis.com.pt Contact a health care provider if:  You are not able to take your medicines as told.  Your symptoms get worse. Get help right away if:  You have serious thoughts about hurting yourself or others. If you ever feel like you may hurt yourself or others, or have thoughts about taking your own life, get help right away. You can go to your nearest emergency department or call:  Your local emergency services (911 in the [place]  A suicide crisis helpline, such as the National Suicide Prevention Lifeline at (705)398-0748. This is open 24 hours a day. This information is not intended to replace advice given  to you by your health care provider. Make sure you discuss any questions you have with your health care provider. Document Released: 12/16/1999 Document Revised: 11/30/2016 Document Reviewed: 09/16/2015 Elsevier Patient Education  2020 [employer]

## 2018-11-10 LAB — BILE ACIDS, TOTAL: Bile Acids Total: 2.3 umol/L (ref 0.0–10.0)

## 2018-11-11 LAB — H. PYLORI ANTIBODY, IGG: H Pylori IgG: 0.2 Index Value (ref 0.00–0.79)

## 2018-11-18 ENCOUNTER — Encounter: Payer: Self-pay | Admitting: Gastroenterology

## 2018-11-25 ENCOUNTER — Encounter (HOSPITAL_COMMUNITY): Payer: Self-pay

## 2018-11-27 ENCOUNTER — Other Ambulatory Visit: Payer: Self-pay

## 2018-12-02 ENCOUNTER — Other Ambulatory Visit (INDEPENDENT_AMBULATORY_CARE_PROVIDER_SITE_OTHER): Payer: Medicaid Other

## 2018-12-02 ENCOUNTER — Other Ambulatory Visit: Payer: Self-pay

## 2018-12-02 ENCOUNTER — Ambulatory Visit: Payer: Medicaid Other | Admitting: Gastroenterology

## 2018-12-02 ENCOUNTER — Encounter: Payer: Self-pay | Admitting: Gastroenterology

## 2018-12-02 VITALS — BP 90/52 | HR 84 | Temp 98.3°F | Ht 62.25 in | Wt 136.0 lb

## 2018-12-02 DIAGNOSIS — R1110 Vomiting, unspecified: Secondary | ICD-10-CM | POA: Insufficient documentation

## 2018-12-02 DIAGNOSIS — R112 Nausea with vomiting, unspecified: Secondary | ICD-10-CM | POA: Diagnosis not present

## 2018-12-02 DIAGNOSIS — R945 Abnormal results of liver function studies: Secondary | ICD-10-CM

## 2018-12-02 DIAGNOSIS — R7989 Other specified abnormal findings of blood chemistry: Secondary | ICD-10-CM

## 2018-12-02 DIAGNOSIS — K5900 Constipation, unspecified: Secondary | ICD-10-CM | POA: Diagnosis not present

## 2018-12-02 DIAGNOSIS — R1033 Periumbilical pain: Secondary | ICD-10-CM

## 2018-12-02 LAB — COMPREHENSIVE METABOLIC PANEL
ALT: 13 U/L (ref 0–35)
AST: 14 U/L (ref 0–37)
Albumin: 3.7 g/dL (ref 3.5–5.2)
Alkaline Phosphatase: 100 U/L (ref 39–117)
BUN: 12 mg/dL (ref 6–23)
CO2: 23 mEq/L (ref 19–32)
Calcium: 9 mg/dL (ref 8.4–10.5)
Chloride: 101 mEq/L (ref 96–112)
Creatinine, Ser: 0.52 mg/dL (ref 0.40–1.20)
GFR: 135.53 mL/min (ref >60.00)
Glucose, Bld: 84 mg/dL (ref 70–99)
Potassium: 3.7 mEq/L (ref 3.5–5.1)
Sodium: 134 mEq/L — ABNORMAL LOW (ref 135–145)
Total Bilirubin: 0.3 mg/dL (ref 0.2–1.2)
Total Protein: 7 g/dL (ref 6.0–8.3)

## 2018-12-02 LAB — CBC
HCT: 38 % (ref 36.0–46.0)
Hemoglobin: 12.7 g/dL (ref 12.0–15.0)
MCHC: 33.5 g/dL (ref 30.0–36.0)
MCV: 94.9 fl (ref 78.0–100.0)
Platelets: 275 10*3/uL (ref 150.0–400.0)
RBC: 4 Mil/uL (ref 3.87–5.11)
RDW: 13.2 % (ref 11.5–15.5)
WBC: 12.5 10*3/uL — ABNORMAL HIGH (ref 4.0–10.5)

## 2018-12-02 LAB — TSH: TSH: 3.14 u[IU]/mL (ref 0.35–4.50)

## 2018-12-02 LAB — PHOSPHORUS: Phosphorus: 3.5 mg/dL (ref 2.3–4.6)

## 2018-12-02 LAB — CORTISOL: Cortisol, Plasma: 11.1 ug/dL

## 2018-12-02 LAB — MAGNESIUM: Magnesium: 1.8 mg/dL (ref 1.5–2.5)

## 2018-12-02 LAB — AMYLASE: Amylase: 33 U/L (ref 27–131)

## 2018-12-02 LAB — HEMOGLOBIN A1C: Hgb A1c MFr Bld: 5.1 % (ref 4.6–6.5)

## 2018-12-02 LAB — LIPASE: Lipase: 7 U/L — ABNORMAL LOW (ref 11.0–59.0)

## 2018-12-02 NOTE — Patient Instructions (Addendum)
If you are age 33 or older, your body mass index should be between 23-30. Your Body mass index is 24.68 kg/m. If this is out of the aforementioned range listed, please consider follow up with your Primary Care Provider.  If you are age 36 or younger, your body mass index should be between 19-25. Your Body mass index is 24.68 kg/m. If this is out of the aformentioned range listed, please consider follow up with your Primary Care Provider.    Your provider has requested that you go to the basement level for lab work before leaving today. Press "B" on the elevator. The lab is located at the first door on the left as you exit the elevator.   Start Colace 200mg  twice daily.  If still having issues then Miralax 17gram  1-2 times daily as needed.   Thank you for choosing me and [facility].  Dr. Rush Landmark
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
## 2018-12-03 ENCOUNTER — Other Ambulatory Visit: Payer: Medicaid Other

## 2018-12-03 DIAGNOSIS — R945 Abnormal results of liver function studies: Secondary | ICD-10-CM

## 2018-12-03 DIAGNOSIS — R112 Nausea with vomiting, unspecified: Secondary | ICD-10-CM

## 2018-12-03 DIAGNOSIS — R1033 Periumbilical pain: Secondary | ICD-10-CM

## 2018-12-03 DIAGNOSIS — K5900 Constipation, unspecified: Secondary | ICD-10-CM

## 2018-12-03 DIAGNOSIS — R7989 Other specified abnormal findings of blood chemistry: Secondary | ICD-10-CM

## 2018-12-03 LAB — HEPATITIS B CORE ANTIBODY, TOTAL: Hep B Core Total Ab: NONREACTIVE

## 2018-12-03 LAB — HEPATITIS B SURFACE ANTIBODY,QUALITATIVE: Hep B S Ab: REACTIVE — AB

## 2018-12-03 LAB — HEPATITIS A ANTIBODY, TOTAL: Hepatitis A AB,Total: REACTIVE — AB

## 2018-12-03 LAB — CALCIUM, IONIZED: Calcium, Ion: 5.17 mg/dL (ref 4.8–5.6)

## 2018-12-04 ENCOUNTER — Other Ambulatory Visit: Payer: Self-pay

## 2018-12-04 DIAGNOSIS — R1033 Periumbilical pain: Secondary | ICD-10-CM

## 2018-12-04 DIAGNOSIS — R112 Nausea with vomiting, unspecified: Secondary | ICD-10-CM

## 2018-12-04 LAB — HELICOBACTER PYLORI  SPECIAL ANTIGEN
MICRO NUMBER:: 1155632
SPECIMEN QUALITY: ADEQUATE

## 2018-12-04 NOTE — Progress Notes (Signed)
he

## 2018-12-09 ENCOUNTER — Other Ambulatory Visit: Payer: Self-pay | Admitting: Obstetrics & Gynecology

## 2019-01-07 ENCOUNTER — Other Ambulatory Visit (HOSPITAL_COMMUNITY): Payer: Self-pay | Admitting: Obstetrics & Gynecology

## 2019-01-07 DIAGNOSIS — Z3689 Encounter for other specified antenatal screening: Secondary | ICD-10-CM

## 2019-01-07 DIAGNOSIS — Z3A28 28 weeks gestation of pregnancy: Secondary | ICD-10-CM

## 2019-01-07 DIAGNOSIS — O365920 Maternal care for other known or suspected poor fetal growth, second trimester, not applicable or unspecified: Secondary | ICD-10-CM

## 2019-01-13 ENCOUNTER — Ambulatory Visit: Payer: Medicaid Other | Admitting: Gastroenterology

## 2019-01-23 ENCOUNTER — Other Ambulatory Visit: Payer: Self-pay

## 2019-01-23 ENCOUNTER — Ambulatory Visit (HOSPITAL_COMMUNITY)
Admission: RE | Admit: 2019-01-23 | Discharge: 2019-01-23 | Disposition: A | Discharge status: Home / Self Care | Payer: Medicaid Other | Source: Ambulatory Visit | Attending: Obstetrics and Gynecology | Admitting: Obstetrics and Gynecology

## 2019-01-23 ENCOUNTER — Other Ambulatory Visit (HOSPITAL_COMMUNITY): Payer: Self-pay | Admitting: Obstetrics & Gynecology

## 2019-01-23 ENCOUNTER — Other Ambulatory Visit (HOSPITAL_COMMUNITY): Payer: Self-pay | Admitting: *Deleted

## 2019-01-23 ENCOUNTER — Encounter (HOSPITAL_COMMUNITY): Payer: Self-pay

## 2019-01-23 ENCOUNTER — Ambulatory Visit (HOSPITAL_COMMUNITY): Payer: Medicaid Other | Admitting: *Deleted

## 2019-01-23 VITALS — BP 108/68 | HR 90 | Temp 97.5°F

## 2019-01-23 DIAGNOSIS — O358XX0 Maternal care for other (suspected) fetal abnormality and damage, not applicable or unspecified: Secondary | ICD-10-CM

## 2019-01-23 DIAGNOSIS — Z3689 Encounter for other specified antenatal screening: Secondary | ICD-10-CM

## 2019-01-23 DIAGNOSIS — Z3A28 28 weeks gestation of pregnancy: Secondary | ICD-10-CM | POA: Insufficient documentation

## 2019-01-23 DIAGNOSIS — O4443 Low lying placenta NOS or without hemorrhage, third trimester: Secondary | ICD-10-CM

## 2019-01-23 DIAGNOSIS — O365930 Maternal care for other known or suspected poor fetal growth, third trimester, not applicable or unspecified: Secondary | ICD-10-CM | POA: Diagnosis not present

## 2019-01-23 DIAGNOSIS — O365990 Maternal care for other known or suspected poor fetal growth, unspecified trimester, not applicable or unspecified: Secondary | ICD-10-CM | POA: Diagnosis present

## 2019-01-23 DIAGNOSIS — O365920 Maternal care for other known or suspected poor fetal growth, second trimester, not applicable or unspecified: Secondary | ICD-10-CM

## 2019-01-27 ENCOUNTER — Encounter (HOSPITAL_COMMUNITY): Payer: Self-pay | Admitting: Obstetrics

## 2019-01-27 ENCOUNTER — Encounter (HOSPITAL_COMMUNITY): Payer: Self-pay | Admitting: Pediatrics

## 2019-02-12 ENCOUNTER — Ambulatory Visit: Payer: Medicaid Other | Admitting: Gastroenterology

## 2019-02-13 ENCOUNTER — Other Ambulatory Visit: Payer: Self-pay

## 2019-02-13 ENCOUNTER — Other Ambulatory Visit (HOSPITAL_COMMUNITY): Payer: Self-pay | Admitting: *Deleted

## 2019-02-13 ENCOUNTER — Ambulatory Visit (HOSPITAL_COMMUNITY): Payer: Medicaid Other | Admitting: *Deleted

## 2019-02-13 ENCOUNTER — Encounter (HOSPITAL_COMMUNITY): Payer: Self-pay

## 2019-02-13 ENCOUNTER — Ambulatory Visit (HOSPITAL_COMMUNITY)
Admission: RE | Admit: 2019-02-13 | Discharge: 2019-02-13 | Disposition: A | Discharge status: Home / Self Care | Payer: Medicaid Other | Source: Ambulatory Visit | Attending: Obstetrics and Gynecology | Admitting: Obstetrics and Gynecology

## 2019-02-13 VITALS — BP 93/54 | HR 75 | Temp 97.0°F

## 2019-02-13 DIAGNOSIS — O365990 Maternal care for other known or suspected poor fetal growth, unspecified trimester, not applicable or unspecified: Secondary | ICD-10-CM

## 2019-02-13 DIAGNOSIS — Z3A36 36 weeks gestation of pregnancy: Secondary | ICD-10-CM | POA: Diagnosis not present

## 2019-02-13 DIAGNOSIS — O0990 Supervision of high risk pregnancy, unspecified, unspecified trimester: Secondary | ICD-10-CM | POA: Diagnosis present

## 2019-02-13 DIAGNOSIS — Z362 Encounter for other antenatal screening follow-up: Secondary | ICD-10-CM

## 2019-02-13 DIAGNOSIS — O365920 Maternal care for other known or suspected poor fetal growth, second trimester, not applicable or unspecified: Secondary | ICD-10-CM

## 2019-02-20 ENCOUNTER — Ambulatory Visit (HOSPITAL_COMMUNITY): Payer: Medicaid Other | Admitting: *Deleted

## 2019-02-20 ENCOUNTER — Encounter (HOSPITAL_COMMUNITY): Payer: Self-pay

## 2019-02-20 ENCOUNTER — Ambulatory Visit (HOSPITAL_COMMUNITY)
Admission: RE | Admit: 2019-02-20 | Discharge: 2019-02-20 | Disposition: A | Discharge status: Home / Self Care | Payer: Medicaid Other | Source: Ambulatory Visit | Attending: Obstetrics and Gynecology | Admitting: Obstetrics and Gynecology

## 2019-02-20 ENCOUNTER — Ambulatory Visit (HOSPITAL_COMMUNITY): Payer: Medicaid Other

## 2019-02-20 ENCOUNTER — Other Ambulatory Visit: Payer: Self-pay

## 2019-02-20 VITALS — BP 100/61 | HR 83 | Temp 97.9°F

## 2019-02-20 DIAGNOSIS — O365990 Maternal care for other known or suspected poor fetal growth, unspecified trimester, not applicable or unspecified: Secondary | ICD-10-CM

## 2019-02-20 DIAGNOSIS — O0990 Supervision of high risk pregnancy, unspecified, unspecified trimester: Secondary | ICD-10-CM | POA: Diagnosis present

## 2019-02-20 DIAGNOSIS — O365930 Maternal care for other known or suspected poor fetal growth, third trimester, not applicable or unspecified: Secondary | ICD-10-CM

## 2019-02-20 DIAGNOSIS — Z3A32 32 weeks gestation of pregnancy: Secondary | ICD-10-CM

## 2019-02-27 ENCOUNTER — Other Ambulatory Visit: Payer: Self-pay

## 2019-02-27 ENCOUNTER — Ambulatory Visit (HOSPITAL_COMMUNITY)
Admission: RE | Admit: 2019-02-27 | Discharge: 2019-02-27 | Disposition: A | Discharge status: Home / Self Care | Payer: Medicaid Other | Source: Ambulatory Visit | Attending: Obstetrics and Gynecology | Admitting: Obstetrics and Gynecology

## 2019-02-27 ENCOUNTER — Ambulatory Visit (HOSPITAL_COMMUNITY): Payer: Medicaid Other | Admitting: *Deleted

## 2019-02-27 ENCOUNTER — Encounter (HOSPITAL_COMMUNITY): Payer: Self-pay

## 2019-02-27 VITALS — BP 109/62 | HR 93 | Temp 97.8°F

## 2019-02-27 DIAGNOSIS — O0990 Supervision of high risk pregnancy, unspecified, unspecified trimester: Secondary | ICD-10-CM | POA: Insufficient documentation

## 2019-02-27 DIAGNOSIS — Z3A33 33 weeks gestation of pregnancy: Secondary | ICD-10-CM | POA: Diagnosis not present

## 2019-02-27 DIAGNOSIS — O358XX0 Maternal care for other (suspected) fetal abnormality and damage, not applicable or unspecified: Secondary | ICD-10-CM

## 2019-02-27 DIAGNOSIS — O4443 Low lying placenta NOS or without hemorrhage, third trimester: Secondary | ICD-10-CM

## 2019-02-27 DIAGNOSIS — O365990 Maternal care for other known or suspected poor fetal growth, unspecified trimester, not applicable or unspecified: Secondary | ICD-10-CM | POA: Insufficient documentation

## 2019-02-27 DIAGNOSIS — O365930 Maternal care for other known or suspected poor fetal growth, third trimester, not applicable or unspecified: Secondary | ICD-10-CM

## 2019-03-06 ENCOUNTER — Encounter (HOSPITAL_COMMUNITY): Payer: Self-pay

## 2019-03-06 ENCOUNTER — Other Ambulatory Visit (HOSPITAL_COMMUNITY): Payer: Self-pay | Admitting: *Deleted

## 2019-03-06 ENCOUNTER — Ambulatory Visit (HOSPITAL_COMMUNITY)
Admission: RE | Admit: 2019-03-06 | Discharge: 2019-03-06 | Disposition: A | Discharge status: Home / Self Care | Payer: Medicaid Other | Source: Ambulatory Visit | Attending: Obstetrics and Gynecology | Admitting: Obstetrics and Gynecology

## 2019-03-06 ENCOUNTER — Other Ambulatory Visit: Payer: Self-pay

## 2019-03-06 ENCOUNTER — Ambulatory Visit (HOSPITAL_COMMUNITY): Payer: Medicaid Other | Admitting: *Deleted

## 2019-03-06 VITALS — BP 106/76 | HR 95 | Temp 97.4°F

## 2019-03-06 DIAGNOSIS — Z3A34 34 weeks gestation of pregnancy: Secondary | ICD-10-CM | POA: Diagnosis not present

## 2019-03-06 DIAGNOSIS — O365990 Maternal care for other known or suspected poor fetal growth, unspecified trimester, not applicable or unspecified: Secondary | ICD-10-CM | POA: Insufficient documentation

## 2019-03-06 DIAGNOSIS — O365930 Maternal care for other known or suspected poor fetal growth, third trimester, not applicable or unspecified: Secondary | ICD-10-CM

## 2019-03-13 ENCOUNTER — Other Ambulatory Visit: Payer: Self-pay

## 2019-03-13 ENCOUNTER — Ambulatory Visit (HOSPITAL_COMMUNITY): Payer: Medicaid Other | Admitting: *Deleted

## 2019-03-13 ENCOUNTER — Encounter (HOSPITAL_COMMUNITY): Payer: Self-pay

## 2019-03-13 ENCOUNTER — Ambulatory Visit (HOSPITAL_COMMUNITY)
Admission: RE | Admit: 2019-03-13 | Discharge: 2019-03-13 | Disposition: A | Discharge status: Home / Self Care | Payer: Medicaid Other | Source: Ambulatory Visit | Attending: Obstetrics and Gynecology | Admitting: Obstetrics and Gynecology

## 2019-03-13 VITALS — BP 103/63 | HR 98 | Temp 97.3°F

## 2019-03-13 DIAGNOSIS — Z3A35 35 weeks gestation of pregnancy: Secondary | ICD-10-CM

## 2019-03-13 DIAGNOSIS — O365930 Maternal care for other known or suspected poor fetal growth, third trimester, not applicable or unspecified: Secondary | ICD-10-CM | POA: Diagnosis not present

## 2019-03-13 DIAGNOSIS — O4443 Low lying placenta NOS or without hemorrhage, third trimester: Secondary | ICD-10-CM | POA: Diagnosis not present

## 2019-03-13 DIAGNOSIS — Z362 Encounter for other antenatal screening follow-up: Secondary | ICD-10-CM

## 2019-03-13 DIAGNOSIS — O365990 Maternal care for other known or suspected poor fetal growth, unspecified trimester, not applicable or unspecified: Secondary | ICD-10-CM | POA: Insufficient documentation

## 2019-03-13 DIAGNOSIS — O0990 Supervision of high risk pregnancy, unspecified, unspecified trimester: Secondary | ICD-10-CM

## 2019-03-13 DIAGNOSIS — O358XX0 Maternal care for other (suspected) fetal abnormality and damage, not applicable or unspecified: Secondary | ICD-10-CM

## 2019-03-17 ENCOUNTER — Encounter (HOSPITAL_COMMUNITY): Payer: Self-pay

## 2019-03-17 NOTE — Patient Instructions (Signed)
[name]  03/17/2019   Your procedure is scheduled on:  03/21/2019  Arrive at 0930 at Entrance C on [address] at [facility]  and [employer]. You are invited to use the FREE valet parking or use the Visitor's parking deck.  Pick up the phone at the desk and dial [phone].  Call this number if you have problems the morning of surgery: [phone]  Remember:   Do not eat food:(After Midnight) Desps de medianoche.  Do not drink clear liquids: (After Midnight) Desps de medianoche.  Take these medicines the morning of surgery with A SIP OF WATER:  none   Do not wear jewelry, make-up or nail polish.  Do not wear lotions, powders, or perfumes. Do not wear deodorant.  Do not shave 48 hours prior to surgery.  Do not bring valuables to the hospital.  [facility] is not   responsible for any belongings or valuables brought to the hospital.  Contacts, dentures or bridgework may not be worn into surgery.  Leave suitcase in the car. After surgery it may be brought to your room.  For patients admitted to the hospital, checkout time is 11:00 AM the day of              discharge.      Please read over the following fact sheets that you were given:     Preparing for Surgery

## 2019-03-18 ENCOUNTER — Other Ambulatory Visit: Payer: Self-pay | Admitting: Obstetrics & Gynecology

## 2019-03-19 ENCOUNTER — Other Ambulatory Visit: Payer: Self-pay

## 2019-03-19 ENCOUNTER — Other Ambulatory Visit (HOSPITAL_COMMUNITY)
Admission: RE | Admit: 2019-03-19 | Discharge: 2019-03-19 | Disposition: A | Discharge status: Home / Self Care | Payer: Medicaid Other | Source: Ambulatory Visit | Attending: Obstetrics and Gynecology | Admitting: Obstetrics and Gynecology

## 2019-03-19 DIAGNOSIS — Z20822 Contact with and (suspected) exposure to covid-19: Secondary | ICD-10-CM | POA: Insufficient documentation

## 2019-03-19 LAB — SARS CORONAVIRUS 2 (TAT 6-24 HRS): SARS Coronavirus 2: NEGATIVE

## 2019-03-19 LAB — CBC
HCT: 39 % (ref 36.0–46.0)
Hemoglobin: 13.1 g/dL (ref 12.0–15.0)
MCH: 31.3 pg (ref 26.0–34.0)
MCHC: 33.6 g/dL (ref 30.0–36.0)
MCV: 93.1 fL (ref 80.0–100.0)
Platelets: 159 10*3/uL (ref 150–400)
RBC: 4.19 MIL/uL (ref 3.87–5.11)
RDW: 13.1 % (ref 11.5–15.5)
WBC: 10.7 10*3/uL — ABNORMAL HIGH (ref 4.0–10.5)
nRBC: 0 % (ref 0.0–0.2)

## 2019-03-19 LAB — TYPE AND SCREEN
ABO/RH(D): A POS
Antibody Screen: NEGATIVE

## 2019-03-19 LAB — RPR: RPR Ser Ql: NONREACTIVE

## 2019-03-19 NOTE — MAU Note (Signed)
Asymptomatic, swab collected. Lab called 

## 2019-03-20 ENCOUNTER — Ambulatory Visit (HOSPITAL_BASED_OUTPATIENT_CLINIC_OR_DEPARTMENT_OTHER)
Admission: RE | Admit: 2019-03-20 | Discharge: 2019-03-20 | Disposition: A | Discharge status: Home / Self Care | Payer: Medicaid Other | Source: Ambulatory Visit | Attending: Obstetrics and Gynecology | Admitting: Obstetrics and Gynecology

## 2019-03-20 ENCOUNTER — Encounter (HOSPITAL_COMMUNITY): Payer: Self-pay

## 2019-03-20 ENCOUNTER — Ambulatory Visit (HOSPITAL_COMMUNITY): Payer: Medicaid Other | Admitting: *Deleted

## 2019-03-20 VITALS — BP 106/60 | HR 81 | Temp 97.9°F

## 2019-03-20 DIAGNOSIS — O365930 Maternal care for other known or suspected poor fetal growth, third trimester, not applicable or unspecified: Secondary | ICD-10-CM | POA: Insufficient documentation

## 2019-03-20 DIAGNOSIS — O365990 Maternal care for other known or suspected poor fetal growth, unspecified trimester, not applicable or unspecified: Secondary | ICD-10-CM | POA: Insufficient documentation

## 2019-03-20 DIAGNOSIS — O358XX0 Maternal care for other (suspected) fetal abnormality and damage, not applicable or unspecified: Secondary | ICD-10-CM | POA: Diagnosis not present

## 2019-03-20 DIAGNOSIS — Z362 Encounter for other antenatal screening follow-up: Secondary | ICD-10-CM | POA: Diagnosis not present

## 2019-03-20 DIAGNOSIS — O4443 Low lying placenta NOS or without hemorrhage, third trimester: Secondary | ICD-10-CM | POA: Diagnosis not present

## 2019-03-20 DIAGNOSIS — Z3A36 36 weeks gestation of pregnancy: Secondary | ICD-10-CM

## 2019-03-21 ENCOUNTER — Inpatient Hospital Stay (HOSPITAL_COMMUNITY)
Admission: RE | Admit: 2019-03-21 | Discharge: 2019-03-23 | DRG: 788 | Disposition: A | Discharge status: Home / Self Care | Payer: Medicaid Other | Attending: Obstetrics & Gynecology | Admitting: Obstetrics & Gynecology

## 2019-03-21 ENCOUNTER — Encounter (HOSPITAL_COMMUNITY): Payer: Self-pay | Admitting: Obstetrics & Gynecology

## 2019-03-21 ENCOUNTER — Other Ambulatory Visit: Payer: Self-pay

## 2019-03-21 ENCOUNTER — Encounter (HOSPITAL_COMMUNITY)
Admission: RE | Disposition: A | Discharge status: Home / Self Care | Payer: Self-pay | Source: Home / Self Care | Attending: Obstetrics & Gynecology

## 2019-03-21 ENCOUNTER — Inpatient Hospital Stay (HOSPITAL_COMMUNITY): Payer: Medicaid Other | Admitting: Anesthesiology

## 2019-03-21 DIAGNOSIS — Z9882 Breast implant status: Secondary | ICD-10-CM

## 2019-03-21 DIAGNOSIS — O358XX0 Maternal care for other (suspected) fetal abnormality and damage, not applicable or unspecified: Secondary | ICD-10-CM | POA: Diagnosis present

## 2019-03-21 DIAGNOSIS — Z3A37 37 weeks gestation of pregnancy: Secondary | ICD-10-CM | POA: Diagnosis not present

## 2019-03-21 DIAGNOSIS — O365930 Maternal care for other known or suspected poor fetal growth, third trimester, not applicable or unspecified: Secondary | ICD-10-CM | POA: Diagnosis present

## 2019-03-21 DIAGNOSIS — O4440 Low lying placenta NOS or without hemorrhage, unspecified trimester: Secondary | ICD-10-CM | POA: Diagnosis present

## 2019-03-21 DIAGNOSIS — O4443 Low lying placenta NOS or without hemorrhage, third trimester: Principal | ICD-10-CM | POA: Diagnosis present

## 2019-03-21 SURGERY — Surgical Case
Anesthesia: Spinal

## 2019-03-21 MED ORDER — ONDANSETRON HCL 4 MG/2ML IJ SOLN
INTRAMUSCULAR | Status: AC
  Filled 2019-03-21: qty 2

## 2019-03-21 MED ORDER — OXYTOCIN 40 UNITS IN NORMAL SALINE INFUSION - SIMPLE MED: 2.5000 [IU]/h | INTRAVENOUS | Status: AC

## 2019-03-21 MED ORDER — FENTANYL CITRATE (PF) 100 MCG/2ML IJ SOLN
INTRAMUSCULAR | Status: DC | PRN
  Administered 2019-03-21: 15 ug via INTRATHECAL

## 2019-03-21 MED ORDER — BUPIVACAINE IN DEXTROSE 0.75-8.25 % IT SOLN
INTRATHECAL | Status: DC | PRN
  Administered 2019-03-21: 1.5 mL via INTRATHECAL

## 2019-03-21 MED ORDER — SODIUM CHLORIDE 0.9% FLUSH: 3.0000 mL | INTRAVENOUS | Status: DC | PRN

## 2019-03-21 MED ORDER — FAMOTIDINE 20 MG PO TABS
ORAL_TABLET | ORAL | Status: AC
  Filled 2019-03-21: qty 1

## 2019-03-21 MED ORDER — LACTATED RINGERS IV SOLN: INTRAVENOUS | Status: DC

## 2019-03-21 MED ORDER — COCONUT OIL OIL: 1.0000 "application " | TOPICAL_OIL | Status: DC | PRN

## 2019-03-21 MED ORDER — ACETAMINOPHEN 500 MG PO TABS
1000.0000 mg | ORAL_TABLET | Freq: Four times a day (QID) | ORAL | Status: AC
  Administered 2019-03-21 – 2019-03-22 (×2): 1000 mg via ORAL
  Administered 2019-03-22: 12:00:00 500 mg via ORAL
  Administered 2019-03-22: 1000 mg via ORAL
  Filled 2019-03-21 (×4): qty 2

## 2019-03-21 MED ORDER — KETOROLAC TROMETHAMINE 30 MG/ML IJ SOLN
30.0000 mg | Freq: Four times a day (QID) | INTRAMUSCULAR | Status: AC | PRN
  Administered 2019-03-21: 30 mg via INTRAVENOUS

## 2019-03-21 MED ORDER — SIMETHICONE 80 MG PO CHEW
80.0000 mg | CHEWABLE_TABLET | ORAL | Status: DC
  Administered 2019-03-22 – 2019-03-23 (×2): 80 mg via ORAL
  Filled 2019-03-21 (×2): qty 1

## 2019-03-21 MED ORDER — PRENATAL MULTIVITAMIN CH
1.0000 | ORAL_TABLET | Freq: Every day | ORAL | Status: DC
  Administered 2019-03-22 – 2019-03-23 (×2): 1 via ORAL
  Filled 2019-03-21 (×2): qty 1

## 2019-03-21 MED ORDER — DIPHENHYDRAMINE HCL 25 MG PO CAPS
25.0000 mg | ORAL_CAPSULE | ORAL | Status: DC | PRN
  Administered 2019-03-22: 04:00:00 25 mg via ORAL
  Filled 2019-03-21: qty 1

## 2019-03-21 MED ORDER — PROMETHAZINE HCL 25 MG/ML IJ SOLN: 6.2500 mg | INTRAMUSCULAR | Status: DC | PRN

## 2019-03-21 MED ORDER — HYDROMORPHONE HCL 1 MG/ML IJ SOLN: 0.2000 mg | INTRAMUSCULAR | Status: DC | PRN

## 2019-03-21 MED ORDER — NALBUPHINE HCL 10 MG/ML IJ SOLN: 5.0000 mg | INTRAMUSCULAR | Status: DC | PRN

## 2019-03-21 MED ORDER — SIMETHICONE 80 MG PO CHEW: 80.0000 mg | CHEWABLE_TABLET | ORAL | Status: DC | PRN

## 2019-03-21 MED ORDER — FENTANYL CITRATE (PF) 100 MCG/2ML IJ SOLN
INTRAMUSCULAR | Status: AC
  Filled 2019-03-21: qty 2

## 2019-03-21 MED ORDER — SCOPOLAMINE 1 MG/3DAYS TD PT72
1.0000 | MEDICATED_PATCH | Freq: Once | TRANSDERMAL | Status: DC
  Administered 2019-03-21: 1.5 mg via TRANSDERMAL

## 2019-03-21 MED ORDER — ZOLPIDEM TARTRATE 5 MG PO TABS: 5.0000 mg | ORAL_TABLET | Freq: Every evening | ORAL | Status: DC | PRN

## 2019-03-21 MED ORDER — SENNOSIDES-DOCUSATE SODIUM 8.6-50 MG PO TABS
2.0000 | ORAL_TABLET | ORAL | Status: DC
  Administered 2019-03-22 – 2019-03-23 (×2): 2 via ORAL
  Filled 2019-03-21 (×2): qty 2

## 2019-03-21 MED ORDER — CEFAZOLIN SODIUM-DEXTROSE 2-4 GM/100ML-% IV SOLN
INTRAVENOUS | Status: AC
  Filled 2019-03-21: qty 100

## 2019-03-21 MED ORDER — ONDANSETRON HCL 4 MG/2ML IJ SOLN: 4.0000 mg | Freq: Three times a day (TID) | INTRAMUSCULAR | Status: DC | PRN

## 2019-03-21 MED ORDER — OXYTOCIN 40 UNITS IN NORMAL SALINE INFUSION - SIMPLE MED
INTRAVENOUS | Status: AC
  Filled 2019-03-21: qty 1000

## 2019-03-21 MED ORDER — FENTANYL CITRATE (PF) 100 MCG/2ML IJ SOLN: 25.0000 ug | INTRAMUSCULAR | Status: DC | PRN

## 2019-03-21 MED ORDER — NALOXONE HCL 4 MG/10ML IJ SOLN
1.0000 ug/kg/h | INTRAVENOUS | Status: DC | PRN
  Filled 2019-03-21: qty 5

## 2019-03-21 MED ORDER — CEFAZOLIN SODIUM-DEXTROSE 2-4 GM/100ML-% IV SOLN
2.0000 g | INTRAVENOUS | Status: AC
  Administered 2019-03-21: 2 g via INTRAVENOUS

## 2019-03-21 MED ORDER — STERILE WATER FOR IRRIGATION IR SOLN
Status: DC | PRN
  Administered 2019-03-21: 1000 mL

## 2019-03-21 MED ORDER — SOD CITRATE-CITRIC ACID 500-334 MG/5ML PO SOLN
ORAL | Status: AC
  Filled 2019-03-21: qty 30

## 2019-03-21 MED ORDER — SOD CITRATE-CITRIC ACID 500-334 MG/5ML PO SOLN
30.0000 mL | Freq: Once | ORAL | Status: AC
  Administered 2019-03-21: 30 mL via ORAL

## 2019-03-21 MED ORDER — KETOROLAC TROMETHAMINE 30 MG/ML IJ SOLN
INTRAMUSCULAR | Status: AC
  Filled 2019-03-21: qty 1

## 2019-03-21 MED ORDER — PHENYLEPHRINE HCL-NACL 20-0.9 MG/250ML-% IV SOLN
INTRAVENOUS | Status: DC | PRN
  Administered 2019-03-21: 60 ug/min via INTRAVENOUS

## 2019-03-21 MED ORDER — NALBUPHINE HCL 10 MG/ML IJ SOLN: 5.0000 mg | Freq: Once | INTRAMUSCULAR | Status: DC | PRN

## 2019-03-21 MED ORDER — FAMOTIDINE 20 MG PO TABS
20.0000 mg | ORAL_TABLET | Freq: Once | ORAL | Status: AC
  Administered 2019-03-21: 20 mg via ORAL

## 2019-03-21 MED ORDER — SCOPOLAMINE 1 MG/3DAYS TD PT72
MEDICATED_PATCH | TRANSDERMAL | Status: AC
  Filled 2019-03-21: qty 1

## 2019-03-21 MED ORDER — OXYTOCIN 40 UNITS IN NORMAL SALINE INFUSION - SIMPLE MED
INTRAVENOUS | Status: DC | PRN
  Administered 2019-03-21: 40 [IU] via INTRAVENOUS

## 2019-03-21 MED ORDER — IBUPROFEN 800 MG PO TABS
800.0000 mg | ORAL_TABLET | Freq: Three times a day (TID) | ORAL | Status: DC
  Administered 2019-03-21 – 2019-03-23 (×6): 800 mg via ORAL
  Filled 2019-03-21 (×6): qty 1

## 2019-03-21 MED ORDER — DIPHENHYDRAMINE HCL 25 MG PO CAPS: 25.0000 mg | ORAL_CAPSULE | Freq: Four times a day (QID) | ORAL | Status: DC | PRN

## 2019-03-21 MED ORDER — SIMETHICONE 80 MG PO CHEW
80.0000 mg | CHEWABLE_TABLET | Freq: Three times a day (TID) | ORAL | Status: DC
  Administered 2019-03-22 – 2019-03-23 (×5): 80 mg via ORAL
  Filled 2019-03-21 (×5): qty 1

## 2019-03-21 MED ORDER — SODIUM CHLORIDE 0.9 % IR SOLN
Status: DC | PRN
  Administered 2019-03-21: 1000 mL

## 2019-03-21 MED ORDER — PHENYLEPHRINE HCL-NACL 20-0.9 MG/250ML-% IV SOLN
INTRAVENOUS | Status: AC
  Filled 2019-03-21: qty 250

## 2019-03-21 MED ORDER — NALOXONE HCL 0.4 MG/ML IJ SOLN: 0.4000 mg | INTRAMUSCULAR | Status: DC | PRN

## 2019-03-21 MED ORDER — DIBUCAINE (PERIANAL) 1 % EX OINT: 1.0000 "application " | TOPICAL_OINTMENT | CUTANEOUS | Status: DC | PRN

## 2019-03-21 MED ORDER — MORPHINE SULFATE (PF) 0.5 MG/ML IJ SOLN
INTRAMUSCULAR | Status: AC
  Filled 2019-03-21: qty 10

## 2019-03-21 MED ORDER — SODIUM CHLORIDE 0.9 % IV SOLN: INTRAVENOUS | Status: DC | PRN

## 2019-03-21 MED ORDER — WITCH HAZEL-GLYCERIN EX PADS: 1.0000 "application " | MEDICATED_PAD | CUTANEOUS | Status: DC | PRN

## 2019-03-21 MED ORDER — ONDANSETRON HCL 4 MG/2ML IJ SOLN
INTRAMUSCULAR | Status: DC | PRN
  Administered 2019-03-21: 4 mg via INTRAVENOUS

## 2019-03-21 MED ORDER — DIPHENHYDRAMINE HCL 50 MG/ML IJ SOLN: 12.5000 mg | INTRAMUSCULAR | Status: DC | PRN

## 2019-03-21 MED ORDER — MORPHINE SULFATE (PF) 0.5 MG/ML IJ SOLN
INTRAMUSCULAR | Status: DC | PRN
  Administered 2019-03-21: .15 mg via INTRATHECAL

## 2019-03-21 MED ORDER — KETOROLAC TROMETHAMINE 30 MG/ML IJ SOLN: 30.0000 mg | Freq: Four times a day (QID) | INTRAMUSCULAR | Status: AC | PRN

## 2019-03-21 MED ORDER — MEPERIDINE HCL 25 MG/ML IJ SOLN: 6.2500 mg | INTRAMUSCULAR | Status: DC | PRN

## 2019-03-21 MED ORDER — OXYCODONE-ACETAMINOPHEN 5-325 MG PO TABS
1.0000 | ORAL_TABLET | ORAL | Status: DC | PRN
  Administered 2019-03-22 (×2): 1 via ORAL
  Administered 2019-03-22: 2 via ORAL
  Filled 2019-03-21 (×3): qty 1
  Filled 2019-03-21: qty 2
  Filled 2019-03-21: qty 1

## 2019-03-21 MED ORDER — MENTHOL 3 MG MT LOZG
1.0000 | LOZENGE | OROMUCOSAL | Status: DC | PRN
  Administered 2019-03-21: 3 mg via ORAL
  Filled 2019-03-21: qty 9

## 2019-03-21 SURGICAL SUPPLY — 39 items
BENZOIN TINCTURE PRP APPL 2/3 (GAUZE/BANDAGES/DRESSINGS) ×3 IMPLANT
CHLORAPREP W/TINT 26ML (MISCELLANEOUS) ×3 IMPLANT
CLAMP CORD UMBIL (MISCELLANEOUS) IMPLANT
CLOSURE STERI STRIP 1/2 X4 (GAUZE/BANDAGES/DRESSINGS) ×3 IMPLANT
CLOTH BEACON ORANGE TIMEOUT ST (SAFETY) ×3 IMPLANT
DERMABOND ADVANCED (GAUZE/BANDAGES/DRESSINGS)
DERMABOND ADVANCED .7 DNX12 (GAUZE/BANDAGES/DRESSINGS) IMPLANT
DRAPE C SECTION CLR SCREEN (DRAPES) ×3 IMPLANT
DRSG OPSITE POSTOP 4X10 (GAUZE/BANDAGES/DRESSINGS) ×3 IMPLANT
ELECT REM PT RETURN 9FT ADLT (ELECTROSURGICAL) ×3
ELECTRODE REM PT RTRN 9FT ADLT (ELECTROSURGICAL) ×1 IMPLANT
EXTRACTOR VACUUM M CUP 4 TUBE (SUCTIONS) IMPLANT
EXTRACTOR VACUUM M CUP 4' TUBE (SUCTIONS)
GLOVE BIOGEL PI IND STRL 7.0 (GLOVE) ×2 IMPLANT
GLOVE BIOGEL PI INDICATOR 7.0 (GLOVE) ×4
GLOVE SURG SS PI 6.5 STRL IVOR (GLOVE) ×3 IMPLANT
GOWN STRL REUS W/TWL LRG LVL3 (GOWN DISPOSABLE) ×6 IMPLANT
KIT ABG SYR 3ML LUER SLIP (SYRINGE) IMPLANT
NEEDLE HYPO 25X5/8 SAFETYGLIDE (NEEDLE) IMPLANT
NS IRRIG 1000ML POUR BTL (IV SOLUTION) ×3 IMPLANT
PACK C SECTION WH (CUSTOM PROCEDURE TRAY) ×3 IMPLANT
PAD OB MATERNITY 4.3X12.25 (PERSONAL CARE ITEMS) ×3 IMPLANT
PENCIL SMOKE EVAC W/HOLSTER (ELECTROSURGICAL) ×3 IMPLANT
RTRCTR C-SECT PINK 25CM LRG (MISCELLANEOUS) ×3 IMPLANT
SUT CHROMIC 1 CTX 36 (SUTURE) IMPLANT
SUT CHROMIC 2 0 CT 1 (SUTURE) ×3 IMPLANT
SUT MON AB 4-0 PS1 27 (SUTURE) ×3 IMPLANT
SUT PLAIN 1 NONE 54 (SUTURE) IMPLANT
SUT PLAIN 2 0 (SUTURE) ×2
SUT PLAIN 2 0 XLH (SUTURE) IMPLANT
SUT PLAIN ABS 2-0 CT1 27XMFL (SUTURE) ×1 IMPLANT
SUT VIC AB 0 CTX 36 (SUTURE) ×2
SUT VIC AB 0 CTX36XBRD ANBCTRL (SUTURE) ×1 IMPLANT
SUT VIC AB 1 CTX 36 (SUTURE) ×4
SUT VIC AB 1 CTX36XBRD ANBCTRL (SUTURE) ×2 IMPLANT
SUT VIC AB 4-0 KS 27 (SUTURE) ×3 IMPLANT
TOWEL OR 17X24 6PK STRL BLUE (TOWEL DISPOSABLE) ×3 IMPLANT
TRAY FOLEY W/BAG SLVR 14FR LF (SET/KITS/TRAYS/PACK) ×3 IMPLANT
WATER STERILE IRR 1000ML POUR (IV SOLUTION) ×3 IMPLANT

## 2019-03-21 NOTE — Anesthesia Preprocedure Evaluation (Addendum)
Anesthesia Evaluation  Patient identified by MRN, date of birth, ID band Patient awake    Reviewed: Allergy & Precautions, NPO status , Patient's Chart, lab work & pertinent test results  Airway Mallampati: II  TM Distance: >3 FB Neck ROM: Full    Dental  (+) Teeth Intact, Dental Advisory Given, Implants   Pulmonary neg pulmonary ROS,    Pulmonary exam normal breath sounds clear to auscultation       Cardiovascular negative cardio ROS Normal cardiovascular exam Rhythm:Regular Rate:Normal     Neuro/Psych PSYCHIATRIC DISORDERS Anxiety Depression negative neurological ROS     GI/Hepatic negative GI ROS, Neg liver ROS,   Endo/Other  negative endocrine ROS  Renal/GU negative Renal ROS     Musculoskeletal negative musculoskeletal ROS (+)   Abdominal   Peds  Hematology negative hematology ROS (+) Plt 159k   Anesthesia Other Findings Day of surgery medications reviewed with the patient.  Reproductive/Obstetrics (+) Pregnancy Cholestasis of Pregnancy, Low Lying Placenta                            Anesthesia Physical Anesthesia Plan  ASA: II  Anesthesia Plan: Spinal   Post-op Pain Management:    Induction:   PONV Risk Score and Plan: 2 and Scopolamine patch - Pre-op, Dexamethasone and Ondansetron  Airway Management Planned: Natural Airway  Additional Equipment:   Intra-op Plan:   Post-operative Plan:   Informed Consent: I have reviewed the patients History and Physical, chart, labs and discussed the procedure including the risks, benefits and alternatives for the proposed anesthesia with the patient or authorized representative who has indicated his/her understanding and acceptance.     Dental advisory given  Plan Discussed with: CRNA  Anesthesia Plan Comments:        Anesthesia Quick Evaluation

## 2019-03-21 NOTE — Transfer of Care (Signed)
Immediate Anesthesia Transfer of Care Note  Patient: Debra Palmer  Procedure(s) Performed: Primary CESAREAN SECTION (N/A )  Patient Location: PACU  Anesthesia Type:Spinal  Level of Consciousness: awake and alert   Airway & Oxygen Therapy: Patient Spontanous Breathing  Post-op Assessment: Report given to RN and Post -op Vital signs reviewed and stable  Post vital signs: Reviewed  Last Vitals:  Vitals Value Taken Time  BP 87/52 03/21/19 1330  Temp    Pulse 68 03/21/19 1336  Resp 18 03/21/19 1336  SpO2 100 % 03/21/19 1336  Vitals shown include unvalidated device data.  Last Pain:  Vitals:   03/21/19 1001  TempSrc: Oral         Complications: No apparent anesthesia complications

## 2019-03-21 NOTE — Op Note (Signed)
Patient: Debra Palmer, Debra Palmer DOB: Jan 31, 1985 MRN: [id number]  DATE OF SURGERY: 03/21/2019  PREOP DIAGNOSIS: 1. Placenta previa- low lying placenta.  2. IUGR less than 3rd % 3. Intrauterine pregnancy at [redacted] weeks EGA.    POSTOP DIAGNOSIS: Same as above.  PROCEDURE: Primary low uterine segment transverse cesarean section via Pfannenstiel incision.     SURGEON: Dr.  Hoover Browns  ASSISTANT: Arlan Organ, CNM  ANESTHESIA: Spinal  COMPLICATIONS: None  FINDINGS: Viable female infant in cephalic presentation, DOA, weight pending 5lb 2 oz, Apgar scores of 9 and 9. Normal uterus and fallopian tubes and ovaries bilaterally.    EBL:  308 cc  IV FLUID:  2300 cc LR   URINE OUTPUT: 250 cc clear urine  INDICATIONS:  34 y/o P 2 who presented for a primary cesarean section for placenta previa- low lying placenta and IUGR.  She was consented for the procedure after explaining risks benefits and alternatives of the procedure.    PROCEDURE:   Informed consent was obtained from the patient to undergo the procedure. She was taken to the operating room where her spinal anesthesia was found to be adequate. She was prepped and draped in the usual sterile fashion and a Foley catheter was placed. She received 2 g of IV Ancef preoperatively. A Pfannenstiel incision was made with the scalpel and the incision extended through the subcutaneous layer and also the fascia with the bovie. Small perforators in the subcutaneous layer were contained with the Bovie. The fascia was nicked in the midline and then was further separated from the rectus muscles bilaterally using Mayo scissors. Kochers were placed inferiorly and then superiorly to allow further separation of fascia from the rectus muscles.  The peritoneal cavity was entered bluntly with the fingers. The Alexis retractor was placed in. The bladder flap was created using Metzenbaum scissors.   The uterus was incised with a scalpel and the incision extended bluntly  bilaterally with fingers and bandage scisors. Membranes were ruptured and moderate clear amniotic fluid was noted.  The head, then the rest of the body was then delivered with abdominal pressure.  She delivered a viable female infant, apgar scores 9, 9.  The cord was clamped and cut after 1 minute. Cord blood was collected.    The uterus was not exteriorized.  The edges of the uterus was grasped with T clamps.  The placenta was delivered with gentle traction on the umbilical cord.  The uterus was cleared of clots and debris with a lap.  The uterine incision was closed with #1 Vicryl in a running locked stitch. An imbricating layer of the same stitch was placed over the initial closure.  Irrigation was applied and suctioned out. Excellent hemostasis was noted over the incision.  The muscles and peritoneum were then reapproximated using chromic suture.  Fascia was closed using 0 Vicryl in a running stitch. The subcutaneous layer was irrigated and suctioned out. Small perforators were contained with the bovie.  The subcutaneous layer was closed using 1-0 plain in interrupted stitches. The skin was closed using 4-0 Vicryl on the Ripley needle. Benzocaine and steri strips were applied.  Honeycomb was then applied. The patient was then cleaned and she was taken to the recovery room with her baby in stable conditions.   SPECIMEN: Placenta to pathology, umbilical cord blood to lab.   DISPOSITION: TO PACU, STABLE.   Dr. Sallye Ober.   03/21/2019. 1527.
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
## 2019-03-21 NOTE — Lactation Note (Signed)
This note was copied from a baby's chart. Lactation Consultation Note Baby 9 hrs old. BF well after delivery, has no interest at this time in BF.  Gave parents LPI information sheet about supplementing. Parents choose Similac verses Donor milk. Gave options of ways to supplement, parents choose bottle w/nipple. Mom stated baby will be getting some bottles at home.  Mom demonstrated expressing colostrum before latching. Mom attempted to BF in cradle position. Baby having low temps, mom doing STS. Newborn behavior for 37 wks, STS, I&O, breast massage, milk storage, positions and props discussed. Mom encouraged to feed baby 8-12 times/24 hours and with feeding cues. Mom encouraged to waken baby for feedings if hasn't cued in 3 hrs. Mom shown how to use DEBP & how to disassemble, clean, & reassemble parts. Mom knows to pump q3h for 15-20 min.  Mom BF her 1st child for brief period didn't BF her 2nd child. Encouraged to call for assistance or questions. Lactation brochure given.   Patient Name: Boy Sumeya Yontz OEHOZ'Y Date: 03/21/2019 Reason for consult: Initial assessment;Infant < 6lbs;Early term 37-38.6wks   Maternal Data Has patient been taught Hand Expression?: Yes Does the patient have breastfeeding experience prior to this delivery?: Yes  Feeding Feeding Type: Formula Nipple Type: Extra Slow Flow(purple nipple)  LATCH Score Latch: Too sleepy or reluctant, no latch achieved, no sucking elicited.  Audible Swallowing: None  Type of Nipple: Everted at rest and after stimulation  Comfort (Breast/Nipple): Soft / non-tender  Hold (Positioning): Assistance needed to correctly position infant at breast and maintain latch.  LATCH Score: 5  Interventions Interventions: Breast feeding basics reviewed;Support pillows;Assisted with latch;Skin to skin;Breast massage;Hand express;Breast compression;Adjust position;DEBP  Lactation Tools Discussed/Used Tools: Pump Breast pump  type: Double-Electric Breast Pump WIC Program: No Pump Review: Setup, frequency, and cleaning;Milk Storage Initiated by:: Peri Jefferson RN IBCLC Date initiated:: 03/21/19   Consult Status Consult Status: Follow-up Date: 03/22/19 Follow-up type: In-patient    Charyl Dancer 03/21/2019, 10:14 PM

## 2019-03-21 NOTE — Brief Op Note (Signed)
03/21/2019  2:22 PM  PATIENT:  Debra Palmer  34 y.o. female  PRE-OPERATIVE DIAGNOSIS:  primary cesarean for placenta previa and IUGR  POST-OPERATIVE DIAGNOSIS:  primary cesarean for placenta previa and IUGR  PROCEDURE:  Procedure(s): Primary CESAREAN SECTION (N/A)  SURGEON:  Surgeon(s) and Role:    * Hoover Browns, MD - Primary  ASSISTANTS: Arlan Organ. CNM   ANESTHESIA:   spinal  EBL:  308 cc   BLOOD ADMINISTERED:none  DRAINS: none   LOCAL MEDICATIONS USED:  NONE  SPECIMEN:  Source of Specimen:  Cord blood, placenta  DISPOSITION OF SPECIMEN:  PATHOLOGY  COUNTS:  YES  TOURNIQUET:  * No tourniquets in log *  DICTATION: .Note written in EPIC  PLAN OF CARE: Admit to inpatient   PATIENT DISPOSITION:  PACU - hemodynamically stable.   Delay start of Pharmacological VTE agent (>24hrs) due to surgical blood loss or risk of bleeding: not applicable.  Dr. Sallye Ober 03/21/2019

## 2019-03-21 NOTE — Anesthesia Procedure Notes (Signed)
Spinal  Patient location during procedure: OR Start time: 03/21/2019 12:04 PM End time: 03/21/2019 12:07 PM Staffing Performed: anesthesiologist  Anesthesiologist: Cecile Hearing, MD Preanesthetic Checklist Completed: patient identified, IV checked, risks and benefits discussed, surgical consent, monitors and equipment checked, pre-op evaluation and timeout performed Spinal Block Patient position: sitting Prep: DuraPrep and site prepped and draped Patient monitoring: continuous pulse ox and blood pressure Approach: midline Location: L3-4 Injection technique: single-shot Needle Needle type: Pencan  Needle gauge: 24 G Assessment Sensory level: T6 Additional Notes Functioning IV was confirmed and monitors were applied. Sterile prep and drape, including hand hygiene, mask and sterile gloves were used. The patient was positioned and the spine was prepped. The skin was anesthetized with lidocaine.  Free flow of clear CSF was obtained prior to injecting local anesthetic into the CSF.  The spinal needle aspirated freely following injection.  The needle was carefully withdrawn.  The patient tolerated the procedure well. Consent was obtained prior to procedure with all questions answered and concerns addressed. Risks including but not limited to bleeding, infection, nerve damage, paralysis, failed block, inadequate analgesia, allergic reaction, high spinal, itching and headache were discussed and the patient wished to proceed.   Arrie Aran, MD

## 2019-03-21 NOTE — Interval H&P Note (Signed)
History and Physical Interval Note:  03/21/2019 11:58 AM  Debra Palmer  has presented today for surgery, with the diagnosis of Low Lying Placenta and IUGR.  The various methods of treatment have been discussed with the patient and family. After consideration of risks, benefits and other options for treatment, the patient has consented to  Procedure(s): Primary CESAREAN SECTION (N/A) as a surgical intervention.  She will not be able to have a BTL as consent form was signed less than 1 month ago, patient will follow up in postpartum period for a tubal ligation.  The patient's history has been reviewed, patient examined, no change in status, stable for surgery.  I have reviewed the patient's chart and labs.  Questions were answered to the patient's satisfaction.     Konrad Felix, MD.

## 2019-03-21 NOTE — Anesthesia Postprocedure Evaluation (Signed)
Anesthesia Post Note  Patient: Debra Palmer  Procedure(s) Performed: Primary CESAREAN SECTION (N/A )     Patient location during evaluation: PACU Anesthesia Type: Spinal Level of consciousness: oriented, awake and alert and awake Pain management: pain level controlled Vital Signs Assessment: post-procedure vital signs reviewed and stable Respiratory status: spontaneous breathing, respiratory function stable and patient connected to nasal cannula oxygen Cardiovascular status: blood pressure returned to baseline and stable Postop Assessment: no headache, no backache, no apparent nausea or vomiting, spinal receding and patient able to bend at knees Anesthetic complications: no    Last Vitals:  Vitals:   03/21/19 1513 03/21/19 1615  BP: (!) 87/55 (!) 96/58  Pulse: 70 62  Resp: 16 16  Temp: 36.5 C   SpO2: 100% 99%    Last Pain:  Vitals:   03/21/19 1855  TempSrc:   PainSc: 6    Pain Goal:                   Cecile Hearing

## 2019-03-22 LAB — CBC
HCT: 36 % (ref 36.0–46.0)
Hemoglobin: 11.9 g/dL — ABNORMAL LOW (ref 12.0–15.0)
MCH: 31.2 pg (ref 26.0–34.0)
MCHC: 33.1 g/dL (ref 30.0–36.0)
MCV: 94.2 fL (ref 80.0–100.0)
Platelets: 152 10*3/uL (ref 150–400)
RBC: 3.82 MIL/uL — ABNORMAL LOW (ref 3.87–5.11)
RDW: 12.9 % (ref 11.5–15.5)
WBC: 10.1 10*3/uL (ref 4.0–10.5)
nRBC: 0 % (ref 0.0–0.2)

## 2019-03-22 MED ORDER — OXYCODONE HCL 5 MG PO TABS
5.0000 mg | ORAL_TABLET | ORAL | Status: DC | PRN
  Administered 2019-03-22 – 2019-03-23 (×3): 10 mg via ORAL
  Filled 2019-03-22 (×3): qty 2

## 2019-03-22 NOTE — Progress Notes (Signed)
Debra Palmer [id number] Postpartum Postoperative Day # 1  Molli Knock, [id number], [redacted]w[redacted]d, S/P primary LT Cesarean Section due to placenta previa with low lying placenta and IUGR.   Subjective: Patient up ad lib, denies syncope or dizziness. Reports consuming regular diet without issues and denies N/V. Patient reports 0 bowel movement + passing flatus.  Denies issues with urination and reports bleeding is "lighter."  Patient is breastfeeding and reports going well.  Desires postpartum tubal at 6 weeks for postpartum contraception.  Pain is being appropriately managed with use of po meds.    Objective: Patient Vitals for the past 24 hrs:  BP Temp Temp src Pulse Resp SpO2 Height Weight  03/22/19 0804 (!) 91/54 97.9 F (36.6 C) Oral 63 18 -- -- --  03/22/19 0400 -- 98.2 F (36.8 C) Oral -- 18 100 % -- --  03/22/19 0250 (!) 94/56 98 F (36.7 C) Oral 69 18 100 % -- --  03/21/19 2253 -- -- -- -- -- 99 % -- --  03/21/19 2000 (!) 89/61 98.4 F (36.9 C) Oral 73 18 99 % -- --  03/21/19 1615 (!) 96/58 -- -- 62 16 99 % -- --  03/21/19 1513 (!) 87/55 97.7 F (36.5 C) Oral 70 16 100 % -- --  03/21/19 1445 (!) 86/60 -- -- 69 17 100 % -- --  03/21/19 1430 91/79 -- -- 70 18 100 % -- --  03/21/19 1415 (!) 87/59 -- -- 76 15 100 % -- --  03/21/19 1400 (!) 63/54 -- -- 72 15 100 % -- --  03/21/19 1345 (!) 91/52 -- -- 66 15 100 % -- --  03/21/19 1332 -- -- -- 75 -- 99 % -- --  03/21/19 1331 (!) 87/52 (!) 97.4 F (36.3 C) Oral -- -- -- -- --  03/21/19 1001 98/64 98.3 F (36.8 C) Oral 90 18 99 % 5\' 2"  (1.575 m) 66.7 kg     Physical Exam:  General: alert, cooperative, appears stated age and no distress Mood/Affect: Happy Lungs: clear to auscultation, no wheezes, rales or rhonchi, symmetric air entry.  Heart: normal rate, regular rhythm, normal S1, S2, no murmurs, rubs, clicks or gallops. Breast: breasts appear normal, no suspicious masses, no skin or nipple changes or axillary  nodes. Abdomen:  + bowel sounds, soft, non-tender Incision: healing well, no significant drainage, no dehiscence, no significant erythema, Honeycomb dressing  Uterine Fundus: firm, involution -1 Lochia: appropriate Skin: Warm, Dry. DVT Evaluation: No evidence of DVT seen on physical exam. Negative Homan's sign. No cords or calf tenderness. No significant calf/ankle edema.  Labs: Recent Labs    03/22/19 0644  HGB 11.9*  HCT 36.0  WBC 10.1    CBG (last 3)  No results for input(s): GLUCAP in the last 72 hours.   I/O: I/O last 3 completed shifts: In: 1500 [I.V.:1500] Out: 1483 [Urine:1175; Blood:308]   Assessment Postpartum Postoperative Day # 1. 03/24/19, [phone], [redacted]w[redacted]d, S/Pprimary LT Cesarean Section due to placenta previa with low lying placenta and IUGR.  Pt stable. -1 Involution. breastFeeding. Hemodynamically Stable with hbg drop from 13.1-11.9 with QBL of 05-28-1988. .  Plan: Continue other mgmt as ordered VTE Prophylactics: SCD, ambulated as tolerates.  Pain control: Motrin/Tylenol/Narcotics PRN Education given regarding options for contraception, including barrier methods, injectable contraception, IUD placement, oral contraceptives.  Breastfeeding, Lactation consult and Contraception postpartum at 6 weeks tubel, baby female requires out pt circ.   Dr. to be updated on patient status.  Mora Appl  [place] NP-C, [place] 03/22/2019, 9:23 AM
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
## 2019-03-23 MED ORDER — IBUPROFEN 800 MG PO TABS: 800.0000 mg | ORAL_TABLET | Freq: Three times a day (TID) | ORAL | 0 refills | Status: DC

## 2019-03-23 MED ORDER — OXYCODONE HCL 5 MG PO TABS: 5.0000 mg | ORAL_TABLET | ORAL | 0 refills | Status: DC | PRN

## 2019-03-23 NOTE — Lactation Note (Signed)
This note was copied from a baby's chart. Lactation Consultation Note Baby 37 hrs old. Mom holding baby in cradle position feeding. Asked mom if it hurt mom stated yes. Suggested to un-swaddle baby. Mom pulled cover down from upper mid body leaving swaddled bottom half of baby. Asked mom if she has tried football. Mom didn't say anything turned baby around into football position. LC positioned pillows for support. Chin tug done, mom stated much better. Encouraged mom to flange lips to widen latch. Mom has good everted nipples. Encouraged to keep baby's cheek to breast to keep deep latch and prevent soreness from pulling on nipples. Mom stated OK. Mom is supplementing w/formula as well.  Patient Name: Debra Palmer [id number] Date: 03/23/2019 Reason for consult: Follow-up assessment;Early term 37-38.6wks   Maternal Data    Feeding Feeding Type: Breast Fed Nipple Type: Extra Slow Flow  LATCH Score Latch: Grasps breast easily, tongue down, lips flanged, rhythmical sucking.  Audible Swallowing: A few with stimulation  Type of Nipple: Everted at rest and after stimulation  Comfort (Breast/Nipple): Soft / non-tender  Hold (Positioning): Assistance needed to correctly position infant at breast and maintain latch.  LATCH Score: 8  Interventions Interventions: Breast feeding basics reviewed;Support pillows;Assisted with latch;Position options;Breast massage;Breast compression;Adjust position  Lactation Tools Discussed/Used     Consult Status Consult Status: Follow-up Date: 03/23/19 Follow-up type: In-patient    Sal Spratley, Diamond Nickel 03/23/2019, 1:50 AM

## 2019-03-23 NOTE — Progress Notes (Signed)
Pt c/o pain 7/10 requesting pain medication. In room to give pt scheduled motrin and prn percocet. When percocet was scanned, advisory popped up regarding tylenol dosage in the last 24 hours. Upon further investigation, noted that pt could not have tylenol products until 0045 to prevent exceeding max dose of 4000mg /24 hours. [facility] CNM called and changed order to oxycodone IR instead of percocet.

## 2019-03-23 NOTE — Lactation Note (Addendum)
This note was copied from a baby's chart. Lactation Consultation Note:  P3, mother reports that she is still attempting to breastfeed "Debra Palmer",  but he doesn't open his mouth very wide and she reports that  her nipple are slightly sore.   Mother feeding infant Neosure with bottle when I arrived in the room. Advised mother in supporting infant upward and stimulating suckling by using bottle nipple to press downward on the back of his tongue and gently pulling forward and un swaddling   Infant very sleepy and milk rolling out of his mouth.  Suggested that mother post pump every 2-3 hours.  Mother is active with WIC . A WIC referral will be sent to [facility] office.  Mother was given a hand pump and comfort gels.  Discussed treatment and prevention of engorgement.   Suggested that mother page [facility] or staff nurse for assistance with next feeding.    Mom knows to pump q3h for 15-20 min. Mom encouraged to feed baby w/feeding cues, Mother to continue to supplement infant with Neosure and ebm after each breastfeeding.   Mom encouraged to feed baby 8-12 times/24 hours and with feeding cues. Mom made aware of O/P services, breastfeeding support groups, community resources, and our phone # for post-discharge questions.   Patient Name: [name] OITGP'Q Date: 03/23/2019 Reason for consult: Follow-up assessment   Maternal Data    Feeding Feeding Type: Formula Nipple Type: Extra Slow Flow  LATCH Score                   Interventions Interventions: Breast massage;Hand express;Pre-pump if needed;Adjust position;Support pillows;Position options;Comfort gels;Hand pump  Lactation Tools Discussed/Used     Consult Status Consult Status: Follow-up Date: 03/23/19    Stevan Born [facility] 03/23/2019, 9:33 AM

## 2019-03-23 NOTE — Discharge Summary (Signed)
Postpartum Discharge Summary     Patient Name: Debra Palmer DOB: 1985/06/03 MRN: [id number]  Date of admission: 03/21/2019 Delivering Provider: Waymon Amato   Date of discharge: 03/23/2019  Admitting diagnosis: Cesarean delivery delivered [O82] Low lying placenta nos or without hemorrhage, third trimester [O44.43] Intrauterine pregnancy: [redacted]w[redacted]d    Secondary diagnosis:  Active Problems:   Low-lying placenta   Cesarean delivery delivered   Low lying placenta nos or without hemorrhage, third trimester      Discharge diagnosis: Cesarean delivery                                                                                                Post partum procedures:NA  Augmentation: NA  Complications: None  Hospital course:  Sceduled C/S   34y.o. yo [id number] admitted to the hospital 03/21/2019 for scheduled cesarean section with the following indication:LLP and IUGR.  Membrane Rupture Time/Date: 12:36 PM ,03/21/2019   Patient delivered a Viable infant.03/21/2019  Details of operation can be found in separate operative note.  Pateint had an uncomplicated postpartum course.  She is ambulating, tolerating a regular diet, passing flatus, and urinating well. Patient is discharged home in stable condition on  03/23/19        Delivery time: 12:36 PM    Magnesium Sulfate received: No BMZ received: No Rhophylac:N/A MMR:N/A Transfusion:No  Physical exam  Vitals:   03/22/19 0804 03/22/19 1436 03/22/19 2117 03/23/19 0616  BP: (!) 91/54 (!) 95/56 91/63 102/73  Pulse: 63 69 87 76  Resp: 18 17 18 18   Temp: 97.9 F (36.6 C) 98.3 F (36.8 C) 98.3 F (36.8 C) 97.7 F (36.5 C)  TempSrc: Oral Oral Oral Oral  SpO2:   97% 99%  Weight:      Height:       General: alert, cooperative and no distress Lochia: appropriate Uterine Fundus: firm Incision: Dressing is clean, dry, and intact DVT Evaluation: No evidence of DVT seen on physical exam. Negative Homan's sign. No  cords or calf tenderness. No significant calf/ankle edema. Labs: Lab Results  Component Value Date   WBC 10.1 03/22/2019   HGB 11.9 (L) 03/22/2019   HCT 36.0 03/22/2019   MCV 94.2 03/22/2019   PLT 152 03/22/2019   CMP Latest Ref Rng & Units 12/02/2018  Glucose 70 - 99 mg/dL 84  BUN 6 - 23 mg/dL 12  Creatinine 0.40 - 1.20 mg/dL 0.52  Sodium 135 - 145 mEq/L 134(L)  Potassium 3.5 - 5.1 mEq/L 3.7  Chloride 96 - 112 mEq/L 101  CO2 19 - 32 mEq/L 23  Calcium 8.4 - 10.5 mg/dL 9.0  Total Protein 6.0 - 8.3 g/dL 7.0  Total Bilirubin 0.2 - 1.2 mg/dL 0.3  Alkaline Phos 39 - 117 U/L 100  AST 0 - 37 U/L 14  ALT 0 - 35 U/L 13   Edinburgh Score: Edinburgh Postnatal Depression Scale Screening Tool 03/22/2019  I have been able to laugh and see the funny side of things. 0  I have looked forward with enjoyment to things. 0  I have blamed myself  unnecessarily when things went wrong. 1  I have been anxious or worried for no good reason. 1  I have felt scared or panicky for no good reason. 1  Things have been getting on top of me. 0  I have been so unhappy that I have had difficulty sleeping. 1  I have felt sad or miserable. 1  I have been so unhappy that I have been crying. 0  The thought of harming myself has occurred to me. 0  Edinburgh Postnatal Depression Scale Total 5    Discharge instruction: per After Visit Summary and "Baby and Me Booklet".  After visit meds:  Allergies as of 03/23/2019   No Known Allergies     Medication List    STOP taking these medications   prenatal multivitamin Tabs tablet   promethazine 25 MG tablet Commonly known as: PHENERGAN     TAKE these medications   ibuprofen 800 MG tablet Commonly known as: ADVIL Take 1 tablet (800 mg total) by mouth every 8 (eight) hours. What changed:   medication strength  how much to take  when to take this  reasons to take this   oxyCODONE 5 MG immediate release tablet Commonly known as: Oxy IR/ROXICODONE Take  1-2 tablets (5-10 mg total) by mouth every 4 (four) hours as needed for moderate pain or severe pain.       Diet: routine diet  Activity: Advance as tolerated. Pelvic rest for 6 weeks.   Outpatient follow up:6 weeks Follow up Appt:No future appointments. Follow up Visit: Follow-up Information    Ob/Gyn, [place] Follow up in 6 week(s).   Specialty: Obstetrics and Gynecology Contact information: [address] [place] [address] [phone]           Newborn Data: Live born female  Birth Weight: 5 lb 5.2 oz (2415 g) APGAR: 9, 9  Newborn Delivery   Birth date/time: 03/21/2019 12:36:00 Delivery type: C-Section, Low Transverse Trial of labor: No C-section categorization: Primary      Baby Feeding: Bottle Disposition:home with mother   03/23/2019 Ike Bene, CNM

## 2019-03-24 LAB — SURGICAL PATHOLOGY

## 2019-03-27 ENCOUNTER — Ambulatory Visit (HOSPITAL_COMMUNITY): Payer: Medicaid Other

## 2019-05-06 ENCOUNTER — Other Ambulatory Visit: Payer: Self-pay | Admitting: Obstetrics & Gynecology

## 2019-05-07 ENCOUNTER — Other Ambulatory Visit: Payer: Self-pay

## 2019-05-07 ENCOUNTER — Encounter (HOSPITAL_BASED_OUTPATIENT_CLINIC_OR_DEPARTMENT_OTHER): Payer: Self-pay | Admitting: Obstetrics & Gynecology

## 2019-05-07 NOTE — Progress Notes (Signed)
Spoke w/ via phone for pre-op interview---patient Lab needs dos----   Urine preg            COVID test ------05-11-2019 at 1445 pm Arrive at -------1200 pm 05-14-2019 No food after midnight, clear liquids until 800 am then npo Medications to take morning of surgery -----none Diabetic medication -----n/a Patient Special Instructions -----none Pre-Op special Istructions -----none Patient verbalized understanding of instructions that were given at this phone interview. Patient denies shortness of breath, chest pain, fever, cough a this phone interview.

## 2019-05-11 ENCOUNTER — Other Ambulatory Visit (HOSPITAL_COMMUNITY)
Admission: RE | Admit: 2019-05-11 | Discharge: 2019-05-11 | Disposition: A | Discharge status: Home / Self Care | Payer: Medicaid Other | Source: Ambulatory Visit | Attending: Obstetrics & Gynecology | Admitting: Obstetrics & Gynecology

## 2019-05-11 DIAGNOSIS — Z01812 Encounter for preprocedural laboratory examination: Secondary | ICD-10-CM | POA: Diagnosis not present

## 2019-05-11 DIAGNOSIS — Z20822 Contact with and (suspected) exposure to covid-19: Secondary | ICD-10-CM | POA: Insufficient documentation

## 2019-05-11 LAB — SARS CORONAVIRUS 2 (TAT 6-24 HRS): SARS Coronavirus 2: NEGATIVE

## 2019-05-13 NOTE — Anesthesia Preprocedure Evaluation (Addendum)
Anesthesia Evaluation  Patient identified by MRN, date of birth, ID band Patient awake    Reviewed: Allergy & Precautions, NPO status , Patient's Chart, lab work & pertinent test results  Airway Mallampati: II  TM Distance: >3 FB Neck ROM: Full    Dental no notable dental hx. (+) Implants, Dental Advisory Given   Pulmonary neg pulmonary ROS,    Pulmonary exam normal breath sounds clear to auscultation       Cardiovascular Exercise Tolerance: Good Normal cardiovascular exam Rhythm:Regular Rate:Normal     Neuro/Psych  Headaches, Anxiety    GI/Hepatic negative GI ROS, Neg liver ROS,   Endo/Other  negative endocrine ROS  Renal/GU negative Renal ROS     Musculoskeletal negative musculoskeletal ROS (+)   Abdominal   Peds  Hematology negative hematology ROS (+)   Anesthesia Other Findings   Reproductive/Obstetrics                           Anesthesia Physical Anesthesia Plan  ASA: II  Anesthesia Plan: General   Post-op Pain Management:    Induction: Intravenous  PONV Risk Score and Plan: Treatment may vary due to age or medical condition  Airway Management Planned: Oral ETT  Additional Equipment: None  Intra-op Plan:   Post-operative Plan: Extubation in OR  Informed Consent: I have reviewed the patients History and Physical, chart, labs and discussed the procedure including the risks, benefits and alternatives for the proposed anesthesia with the patient or authorized representative who has indicated his/her understanding and acceptance.     Dental advisory given  Plan Discussed with:   Anesthesia Plan Comments:        Anesthesia Quick Evaluation

## 2019-05-13 NOTE — Progress Notes (Signed)
Pt aware of arrival time 1100 05/14/2019 for surgery.
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
## 2019-05-14 ENCOUNTER — Ambulatory Visit (HOSPITAL_BASED_OUTPATIENT_CLINIC_OR_DEPARTMENT_OTHER): Payer: Medicaid Other | Admitting: Anesthesiology

## 2019-05-14 ENCOUNTER — Encounter (HOSPITAL_BASED_OUTPATIENT_CLINIC_OR_DEPARTMENT_OTHER): Payer: Self-pay | Admitting: Obstetrics & Gynecology

## 2019-05-14 ENCOUNTER — Other Ambulatory Visit: Payer: Self-pay

## 2019-05-14 ENCOUNTER — Ambulatory Visit (HOSPITAL_BASED_OUTPATIENT_CLINIC_OR_DEPARTMENT_OTHER)
Admission: RE | Admit: 2019-05-14 | Discharge: 2019-05-14 | Disposition: A | Discharge status: Home / Self Care | Payer: Medicaid Other | Attending: Obstetrics & Gynecology | Admitting: Obstetrics & Gynecology

## 2019-05-14 ENCOUNTER — Encounter (HOSPITAL_BASED_OUTPATIENT_CLINIC_OR_DEPARTMENT_OTHER)
Admission: RE | Disposition: A | Discharge status: Home / Self Care | Payer: Self-pay | Source: Home / Self Care | Attending: Obstetrics & Gynecology

## 2019-05-14 DIAGNOSIS — R519 Headache, unspecified: Secondary | ICD-10-CM | POA: Insufficient documentation

## 2019-05-14 DIAGNOSIS — F329 Major depressive disorder, single episode, unspecified: Secondary | ICD-10-CM | POA: Diagnosis not present

## 2019-05-14 DIAGNOSIS — F410 Panic disorder [episodic paroxysmal anxiety] without agoraphobia: Secondary | ICD-10-CM | POA: Insufficient documentation

## 2019-05-14 DIAGNOSIS — F419 Anxiety disorder, unspecified: Secondary | ICD-10-CM | POA: Insufficient documentation

## 2019-05-14 DIAGNOSIS — Z302 Encounter for sterilization: Secondary | ICD-10-CM | POA: Insufficient documentation

## 2019-05-14 HISTORY — DX: Headache, unspecified: R51.9

## 2019-05-14 HISTORY — DX: Myoneural disorder, unspecified: G70.9

## 2019-05-14 HISTORY — PX: LAPAROSCOPIC TUBAL LIGATION: SHX1937

## 2019-05-14 LAB — POCT PREGNANCY, URINE: Preg Test, Ur: NEGATIVE

## 2019-05-14 SURGERY — LIGATION, FALLOPIAN TUBE, LAPAROSCOPIC
Anesthesia: General | Site: Abdomen | Laterality: Bilateral

## 2019-05-14 MED ORDER — ACETAMINOPHEN 10 MG/ML IV SOLN
INTRAVENOUS | Status: AC
  Filled 2019-05-14: qty 100

## 2019-05-14 MED ORDER — HYDROMORPHONE HCL 1 MG/ML IJ SOLN: 0.2500 mg | INTRAMUSCULAR | Status: DC | PRN

## 2019-05-14 MED ORDER — SILVER NITRATE-POT NITRATE 75-25 % EX MISC
CUTANEOUS | Status: DC | PRN
  Administered 2019-05-14: 1 via TOPICAL

## 2019-05-14 MED ORDER — FENTANYL CITRATE (PF) 100 MCG/2ML IJ SOLN
INTRAMUSCULAR | Status: DC | PRN
  Administered 2019-05-14: 100 ug via INTRAVENOUS

## 2019-05-14 MED ORDER — HYDROMORPHONE HCL 1 MG/ML IJ SOLN
INTRAMUSCULAR | Status: DC | PRN
  Administered 2019-05-14: .5 mg via INTRAVENOUS

## 2019-05-14 MED ORDER — ONDANSETRON HCL 4 MG/2ML IJ SOLN
INTRAMUSCULAR | Status: AC
  Filled 2019-05-14: qty 2

## 2019-05-14 MED ORDER — KETOROLAC TROMETHAMINE 30 MG/ML IJ SOLN
INTRAMUSCULAR | Status: DC | PRN
  Administered 2019-05-14: 30 mg via INTRAVENOUS

## 2019-05-14 MED ORDER — KETOROLAC TROMETHAMINE 30 MG/ML IJ SOLN: 30.0000 mg | Freq: Once | INTRAMUSCULAR | Status: DC | PRN

## 2019-05-14 MED ORDER — DEXAMETHASONE SODIUM PHOSPHATE 10 MG/ML IJ SOLN
INTRAMUSCULAR | Status: AC
  Filled 2019-05-14: qty 1

## 2019-05-14 MED ORDER — OXYCODONE HCL 5 MG/5ML PO SOLN: 5.0000 mg | Freq: Once | ORAL | Status: DC | PRN

## 2019-05-14 MED ORDER — DEXAMETHASONE SODIUM PHOSPHATE 10 MG/ML IJ SOLN
INTRAMUSCULAR | Status: DC | PRN
  Administered 2019-05-14: 10 mg via INTRAVENOUS

## 2019-05-14 MED ORDER — IBUPROFEN 800 MG PO TABS
800.0000 mg | ORAL_TABLET | Freq: Three times a day (TID) | ORAL | 0 refills | Status: AC | PRN
Start: 2019-05-14 — End: 2019-05-24

## 2019-05-14 MED ORDER — PROPOFOL 10 MG/ML IV BOLUS
INTRAVENOUS | Status: AC
  Filled 2019-05-14: qty 20

## 2019-05-14 MED ORDER — ONDANSETRON HCL 4 MG/2ML IJ SOLN
4.0000 mg | Freq: Once | INTRAMUSCULAR | Status: AC | PRN
  Administered 2019-05-14: 4 mg via INTRAVENOUS

## 2019-05-14 MED ORDER — OXYCODONE HCL 5 MG PO TABS: 5.0000 mg | ORAL_TABLET | Freq: Once | ORAL | Status: DC | PRN

## 2019-05-14 MED ORDER — LIDOCAINE-EPINEPHRINE 1 %-1:100000 IJ SOLN
INTRAMUSCULAR | Status: DC | PRN
  Administered 2019-05-14: 5 mL

## 2019-05-14 MED ORDER — ACETAMINOPHEN 10 MG/ML IV SOLN
INTRAVENOUS | Status: DC | PRN
  Administered 2019-05-14: 1000 mg via INTRAVENOUS

## 2019-05-14 MED ORDER — ROCURONIUM BROMIDE 10 MG/ML (PF) SYRINGE
PREFILLED_SYRINGE | INTRAVENOUS | Status: DC | PRN
  Administered 2019-05-14: 40 mg via INTRAVENOUS

## 2019-05-14 MED ORDER — MIDAZOLAM HCL 2 MG/2ML IJ SOLN
INTRAMUSCULAR | Status: AC
  Filled 2019-05-14: qty 2

## 2019-05-14 MED ORDER — DEXMEDETOMIDINE HCL IN NACL 400 MCG/100ML IV SOLN
INTRAVENOUS | Status: DC | PRN
  Administered 2019-05-14: 2 ug via INTRAVENOUS
  Administered 2019-05-14: 4 ug via INTRAVENOUS
  Administered 2019-05-14: 8 ug via INTRAVENOUS
  Administered 2019-05-14 (×3): 2 ug via INTRAVENOUS

## 2019-05-14 MED ORDER — HYDROMORPHONE HCL 2 MG/ML IJ SOLN
INTRAMUSCULAR | Status: AC
  Filled 2019-05-14: qty 1

## 2019-05-14 MED ORDER — MIDAZOLAM HCL 2 MG/2ML IJ SOLN
INTRAMUSCULAR | Status: DC | PRN
  Administered 2019-05-14: 2 mg via INTRAVENOUS

## 2019-05-14 MED ORDER — LIDOCAINE 2% (20 MG/ML) 5 ML SYRINGE
INTRAMUSCULAR | Status: DC | PRN
  Administered 2019-05-14: 20 mg via INTRAVENOUS
  Administered 2019-05-14: 80 mg via INTRAVENOUS

## 2019-05-14 MED ORDER — LACTATED RINGERS IV SOLN: INTRAVENOUS | Status: DC

## 2019-05-14 MED ORDER — FENTANYL CITRATE (PF) 100 MCG/2ML IJ SOLN
INTRAMUSCULAR | Status: AC
  Filled 2019-05-14: qty 2

## 2019-05-14 MED ORDER — SCOPOLAMINE 1 MG/3DAYS TD PT72
MEDICATED_PATCH | TRANSDERMAL | Status: AC
  Filled 2019-05-14: qty 1

## 2019-05-14 MED ORDER — PROPOFOL 10 MG/ML IV BOLUS
INTRAVENOUS | Status: DC | PRN
  Administered 2019-05-14: 60 mg via INTRAVENOUS
  Administered 2019-05-14: 200 mg via INTRAVENOUS
  Administered 2019-05-14: 20 mg via INTRAVENOUS

## 2019-05-14 MED ORDER — ONDANSETRON HCL 4 MG/2ML IJ SOLN
INTRAMUSCULAR | Status: DC | PRN
  Administered 2019-05-14 (×2): 4 mg via INTRAVENOUS

## 2019-05-14 MED ORDER — SCOPOLAMINE 1 MG/3DAYS TD PT72
MEDICATED_PATCH | TRANSDERMAL | Status: DC | PRN
  Administered 2019-05-14: 1 via TRANSDERMAL

## 2019-05-14 MED ORDER — KETOROLAC TROMETHAMINE 30 MG/ML IJ SOLN
INTRAMUSCULAR | Status: AC
  Filled 2019-05-14: qty 1

## 2019-05-14 SURGICAL SUPPLY — 30 items
BALLN HASSAN TROCAR (MISCELLANEOUS) ×2 IMPLANT
CABLE HIGH FREQUENCY MONO STRZ (ELECTRODE) ×2 IMPLANT
CANISTER SUCTION 2500CC (MISCELLANEOUS) ×3 IMPLANT
COVER WAND RF STERILE (DRAPES) ×3 IMPLANT
DECANTER SPIKE VIAL GLASS SM (MISCELLANEOUS) ×3 IMPLANT
DRSG COVADERM PLUS 2X2 (GAUZE/BANDAGES/DRESSINGS) ×2 IMPLANT
DRSG OPSITE POSTOP 3X4 (GAUZE/BANDAGES/DRESSINGS) ×3 IMPLANT
DURAPREP 26ML APPLICATOR (WOUND CARE) ×3 IMPLANT
ELECT PENCIL ROCKER SW 15FT (MISCELLANEOUS) ×2 IMPLANT
GAUZE 4X4 16PLY RFD (DISPOSABLE) ×3 IMPLANT
GLOVE BIOGEL PI IND STRL 7.0 (GLOVE) ×2 IMPLANT
GLOVE BIOGEL PI INDICATOR 7.0 (GLOVE) ×4
GLOVE ECLIPSE 6.5 STRL STRAW (GLOVE) ×3 IMPLANT
GOWN STRL REUS W/TWL LRG LVL3 (GOWN DISPOSABLE) ×9 IMPLANT
LIGASURE VESSEL 5MM BLUNT TIP (ELECTROSURGICAL) IMPLANT
NS IRRIG 1000ML POUR BTL (IV SOLUTION) ×3 IMPLANT
PACK LAPAROSCOPY BASIN (CUSTOM PROCEDURE TRAY) ×3 IMPLANT
PACK TRENDGUARD 450 HYBRID PRO (MISCELLANEOUS) IMPLANT
PROTECTOR NERVE ULNAR (MISCELLANEOUS) ×6 IMPLANT
SET SUCTION IRRIG HYDROSURG (IRRIGATION / IRRIGATOR) IMPLANT
SLEEVE XCEL OPT CAN 5 100 (ENDOMECHANICALS) ×3 IMPLANT
SOLUTION ELECTROLUBE (MISCELLANEOUS) ×2 IMPLANT
SUT MNCRL AB 4-0 PS2 18 (SUTURE) ×5 IMPLANT
SUT VICRYL 0 UR6 27IN ABS (SUTURE) ×2 IMPLANT
TOWEL OR 17X26 10 PK STRL BLUE (TOWEL DISPOSABLE) ×6 IMPLANT
TRAY FOLEY W/BAG SLVR 14FR (SET/KITS/TRAYS/PACK) ×3 IMPLANT
TRENDGUARD 450 HYBRID PRO PACK (MISCELLANEOUS) ×3
TROCAR BALLN 12MMX100 BLUNT (TROCAR) ×2 IMPLANT
TROCAR BLADELESS OPT 5 100 (ENDOMECHANICALS) ×3 IMPLANT
WARMER LAPAROSCOPE (MISCELLANEOUS) ×3 IMPLANT

## 2019-05-14 NOTE — Interval H&P Note (Signed)
History and Physical Interval Note:  05/14/2019 12:49 PM  Debra Palmer  has presented today for surgery, with the diagnosis of Desire for surgical sterilzation.  The various methods of treatment have been discussed with the patient and family. After consideration of risks, benefits and other options for treatment, the patient has consented to  Procedure(s): LAPAROSCOPIC TUBAL LIGATION (Bilateral) via fulguration as a surgical intervention.  The patient's history has been reviewed, patient examined, no change in status, stable for surgery.  I have reviewed the patient's chart and labs.  Questions were answered to the patient's satisfaction.     Konrad Felix, MD.

## 2019-05-14 NOTE — Anesthesia Procedure Notes (Signed)
Procedure Name: Intubation Performed by: Mechele Claude, CRNA Pre-anesthesia Checklist: Patient identified, Emergency Drugs available, Suction available and Patient being monitored Patient Re-evaluated:Patient Re-evaluated prior to induction Oxygen Delivery Method: Circle system utilized Preoxygenation: Pre-oxygenation with 100% oxygen Induction Type: IV induction Ventilation: Mask ventilation without difficulty Laryngoscope Size: Mac and 3 Grade View: Grade I Tube type: Oral Number of attempts: 2 (need to reposition patient's head.) Airway Equipment and Method: Stylet and Oral airway Placement Confirmation: ETT inserted through vocal cords under direct vision,  positive ETCO2 and breath sounds checked- equal and bilateral Secured at: 21 cm Tube secured with: Tape Dental Injury: Teeth and Oropharynx as per pre-operative assessment

## 2019-05-14 NOTE — Transfer of Care (Signed)
Immediate Anesthesia Transfer of Care Note  Patient: Debra Palmer  Procedure(s) Performed: Procedure(s) (LRB): LAPAROSCOPIC TUBAL LIGATION (Bilateral)  Patient Location: PACU  Anesthesia Type: General  Level of Consciousness: awake, alert  and oriented  Airway & Oxygen Therapy: Patient Spontanous Breathing and Patient connected to nasal cannula oxygen  Post-op Assessment: Report given to PACU RN and Post -op Vital signs reviewed and stable  Post vital signs: Reviewed and stable  Complications: No apparent anesthesia complications Last Vitals:  Vitals Value Taken Time  BP 102/64 05/14/19 1524  Temp    Pulse 67 05/14/19 1527  Resp 12 05/14/19 1527  SpO2 100 % 05/14/19 1527  Vitals shown include unvalidated device data.  Last Pain:  Vitals:   05/14/19 1136  TempSrc: Oral  PainSc: 0-No pain      Patients Stated Pain Goal: 2 (05/14/19 1136)

## 2019-05-14 NOTE — Discharge Instructions (Signed)
Karlita,  You may remove the incision dressings in one week (next week on Friday) but they may fall out by themselves sooner than that and that's okay.  You may shower as usual, keeping water exposure over the abdomen to a minimal and patting the incisions dry after showering.  To remove the dressings in 1 week, soak them wet with water (allow water to soak them during showering) and then gently peel them away starting at the corners.   After showering always pat the incisions dry.     Do not have any intercourse after the procedure until cleared by the doctor at the 2 week post op check.  Expect some light vaginal bleeding, do not use tampons or douching for the next two weeks.   Call the office if with any complaints or questions at phone [phone].  During regular business hours you may press extension [phone]  Dr. Alesia Richards.  DISCHARGE INSTRUCTIONS: Laparoscopy  The following instructions have been prepared to help you care for yourself upon your return home today.  Wound care: Marland Kitchen Do not get the incision wet for the first 24 hours. The incision should be kept clean and dry.. . Should the incision become sore, red, and swollen after the first week, check with your doctor.  Personal hygiene: . Shower the day after your procedure.  Activity and limitations: . Do NOT drive or operate any equipment today. . Do NOT lift anything more than 15 pounds for 2-3 weeks after surgery. . Do NOT rest in bed all day. . Walking is encouraged. Walk each day, starting slowly with 5-minute walks 3 or 4 times a day. Slowly increase the length of your walks. . Walk up and down stairs slowly. . Do NOT do strenuous activities, such as golfing, playing tennis, bowling, running, biking, weight lifting, gardening, mowing, or vacuuming for 2-4 weeks. Ask your doctor when it is okay to start.  Diet: Eat a light meal as desired this evening. You may resume your usual diet tomorrow.  Return to work: This is  dependent on the type of work you do. For the most part you can return to a desk job within a week of surgery. If you are more active at work, please discuss this with your doctor.  What to expect after your surgery: You may have a slight burning sensation when you urinate on the first day. You may have a very small amount of blood in the urine. Expect to have a small amount of vaginal discharge/light bleeding for 1-2 weeks. It is not unusual to have abdominal soreness and bruising for up to 2 weeks. You may be tired and need more rest for about 1 week. You may experience shoulder pain for 24-72 hours. Lying flat in bed may relieve it.  Call your doctor for any of the following: . Develop a fever of 100.4 or greater . Inability to urinate 6 hours after discharge from hospital . Severe pain not relieved by pain medications . Persistent of heavy bleeding at incision site . Redness or swelling around incision site after a week . Increasing nausea or vomiting  Post Anesthesia Home Care Instructions  Activity: Get plenty of rest for the remainder of the day. A responsible adult should stay with you for 24 hours following the procedure.  For the next 24 hours, DO NOT: -Drive a car -Paediatric nurse -Drink alcoholic beverages -Take any medication unless instructed by your physician -Make any legal decisions or sign important papers.  Meals:  Start with liquid foods such as gelatin or soup. Progress to regular foods as tolerated. Avoid greasy, spicy, heavy foods. If nausea and/or vomiting occur, drink only clear liquids until the nausea and/or vomiting subsides. Call your physician if vomiting continues.  Special Instructions/Symptoms: Your throat may feel dry or sore from the anesthesia or the breathing tube placed in your throat during surgery. If this causes discomfort, gargle with warm salt water. The discomfort should disappear within 24 hours.  If you had a scopolamine patch placed behind  your ear for the management of post- operative nausea and/or vomiting:  1. The medication in the patch is effective for 72 hours, after which it should be removed.  Wrap patch in a tissue and discard in the trash. Wash hands thoroughly with soap and water. 2. You may remove the patch earlier than 72 hours if you experience unpleasant side effects which may include dry mouth, dizziness or visual disturbances. 3. Avoid touching the patch. Wash your hands with soap and water after contact with the patch.

## 2019-05-14 NOTE — Op Note (Signed)
   Molli Knock.  MRN [id number] DOB 03-Jun-1985.      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    POSTOP DIAGNOSIS: Same as above   PROCEDURE: Laparoscopic bilateral fulguration.    SURGEON: Dr. Hoover Browns   ASSISTANT: None.   SCRUB TECHNICIAN: KATIE, MAGGIE   ANESTHESIA: General ETA.   COMPLICATIONS: None   EBL: 10 mL   IV FLUID: 1000 mL LR   URINE OUTPUT: 200 mL   FINDINGS: Normal uterus. Normal left and right fallopian tubes.  Normal left and right ovaries.    PROCEDURE:    Informed consent was obtained from the patient to undergo the procedure after discussing the risks benefits and alternatives of the procedure. She was taken to the operating room where anesthesia was administered without difficulty. Both arms were tucked and she was placed in the dorsal lithotomy position. She was prepped abdominally, vaginally and perineum in the usual sterile fashion. Foley catheter was placed in the bladder and a  Hulka uterine manipulator was placed in.     Attention was then turned to the abdomen where lidocaine with epinephrine was instilled in the infraumbilical area. The skin was incised with a scalpel and incision was carried through the subcutaneous, fascia and peritoneal layers using Allis clamps hemostats and S retractors for retraction. The fascia was tied with 0 Vicryl at the edges.  The 11 mm Hasson trocar was placed in and gas 20 mL gas instilled into the bulb.  The abdomen was insufflated with gas and abdominal pressure maintained at .  The scope was then placed in and the pelvis was visualized. One 5 mm port was placed using the XL trocar on the left lower abdomen side under direct visualization. The left fallopian tube was then fulgurated after following it to the fimbria end with the Kleppinger at 40 watts standard (cutting) current about 3 cm distal to the uterine cornua, first proximal region was fulgurated, then distal  portion then mid portion with blanching of the tube noted.  The right fallopian tube was similarly fulgurated, first proximal region was fulgurated, then distal portion then mid portion with blanching of the tube noted.  The fulgurated portions of tubes bilaterally were then transected with the endo shears.  The pelvis was inspected and it was noted to be hemostatic and normal. All instruments were removed.       Umbilical incision was closed using 0 Vicryl  Suture.  The skin was closed with 4-0 Monocryl. The left side ports incisions were also closed with 4-0 Monocryl. Dermabond and 2 by 2  dressings were placed. The Foley catheter and the Hulka manipulator were removed, silver nitrate applied for hemostasis over the Hulka site.  The patient was then awoken from anesthesia and she was taken to recovery room in stable condition   SPECIMEN: None.     Elliyah Liszewski Sallye Ober, MD.

## 2019-05-15 NOTE — Anesthesia Postprocedure Evaluation (Signed)
Anesthesia Post Note  Patient: Debra Palmer  Procedure(s) Performed: LAPAROSCOPIC TUBAL LIGATION (Bilateral Abdomen)     Patient location during evaluation: PACU Anesthesia Type: General Level of consciousness: awake and alert Pain management: pain level controlled Vital Signs Assessment: post-procedure vital signs reviewed and stable Respiratory status: spontaneous breathing, nonlabored ventilation and respiratory function stable Cardiovascular status: blood pressure returned to baseline and stable Postop Assessment: no apparent nausea or vomiting Anesthetic complications: no    Last Vitals:  Vitals:   05/14/19 1630 05/14/19 1700  BP: 100/67 101/74  Pulse: 65 71  Resp: 17 16  Temp:  36.5 C  SpO2: 100% 100%    Last Pain:  Vitals:   05/14/19 1730  TempSrc:   PainSc: 0-No pain   Pain Goal: Patients Stated Pain Goal: 2 (05/14/19 1136)                 [name]

## 2021-12-15 IMAGING — US US MFM UA CORD DOPPLER
1 series · 12 of 28 positions shown · non-contrast
Comparison: none

[Series 1: us mfm ua cord doppler · 12 of 84 slices shown]
[im 4/84]
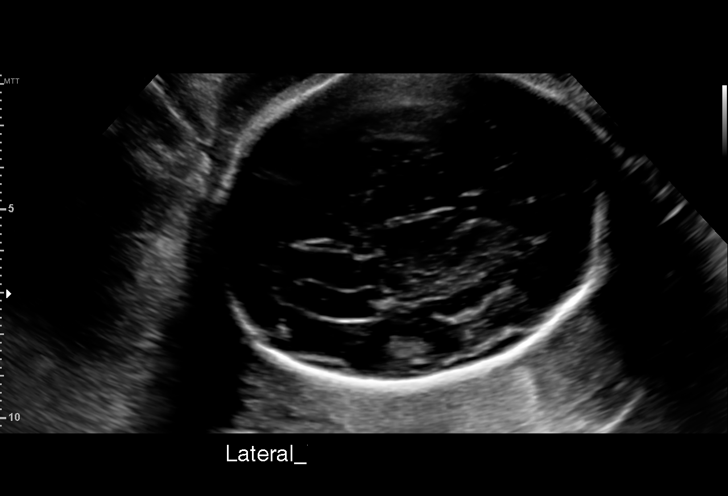
[im 10/84]
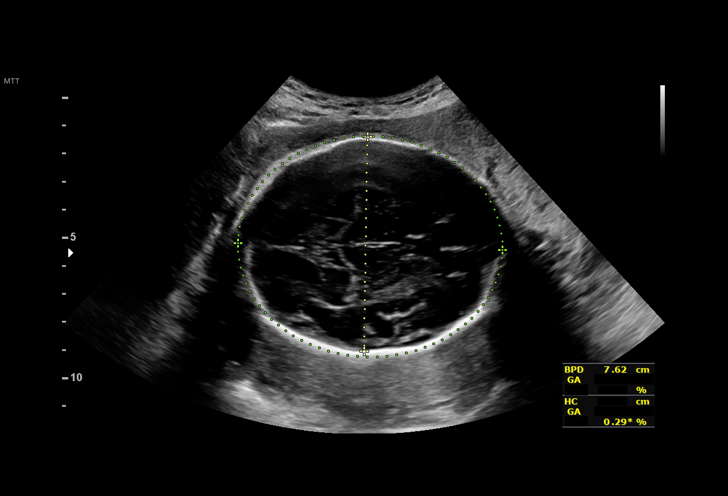
[im 16/84]
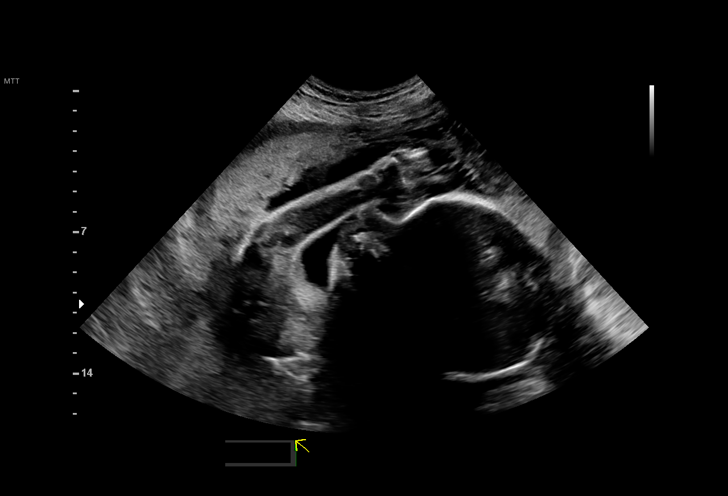
[im 25/84]
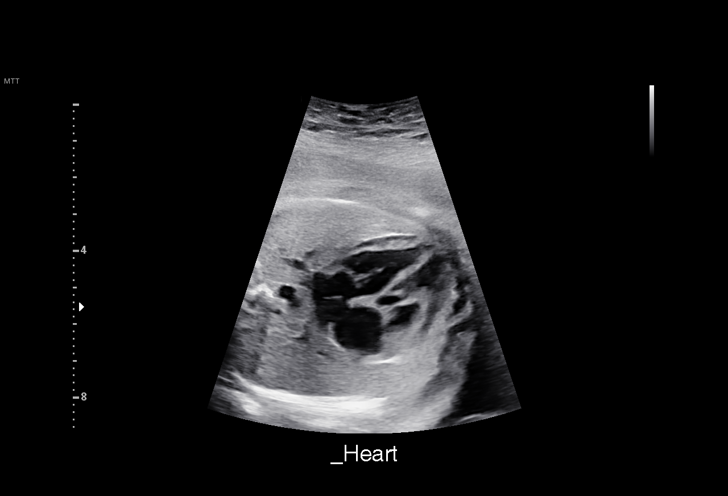
[im 31/84]
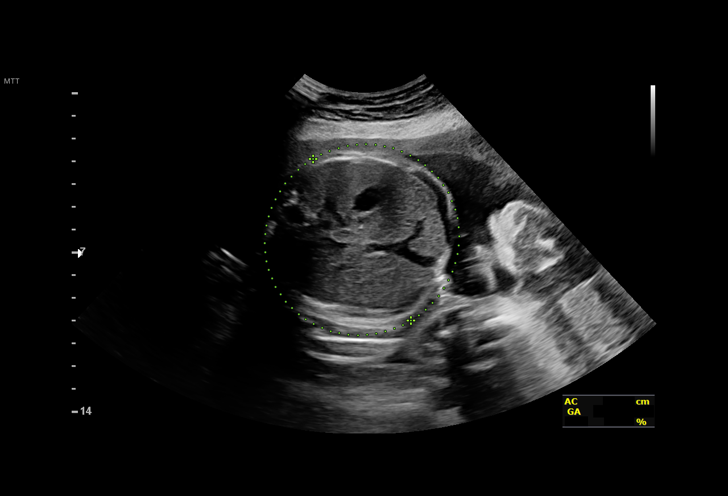
[im 37/84]
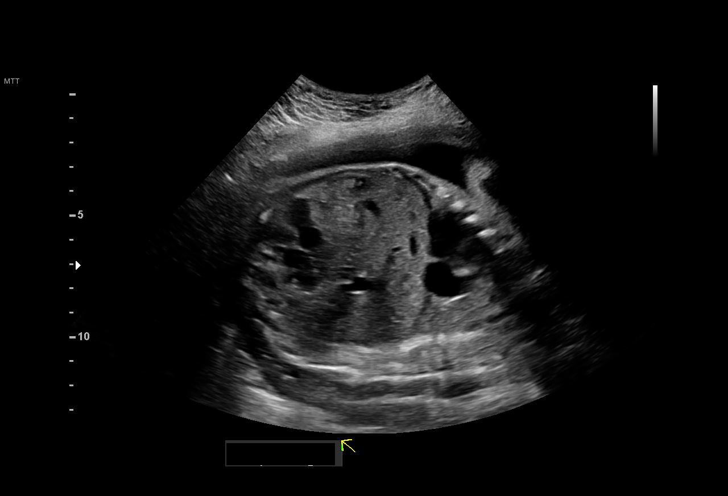
[im 47/84]
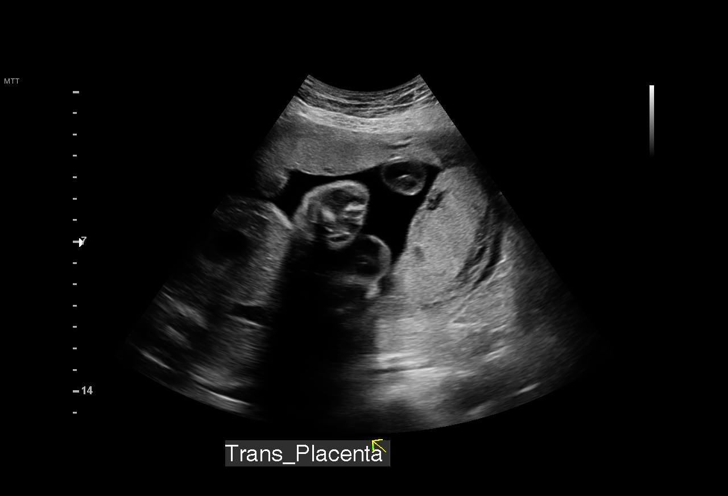
[im 53/84]
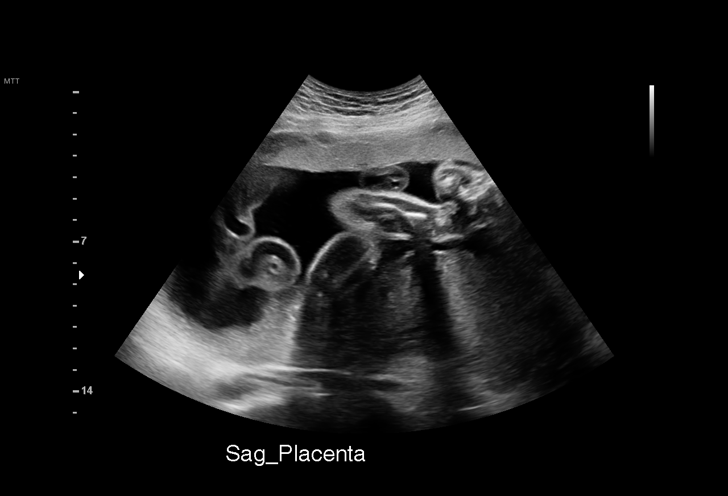
[im 59/84]
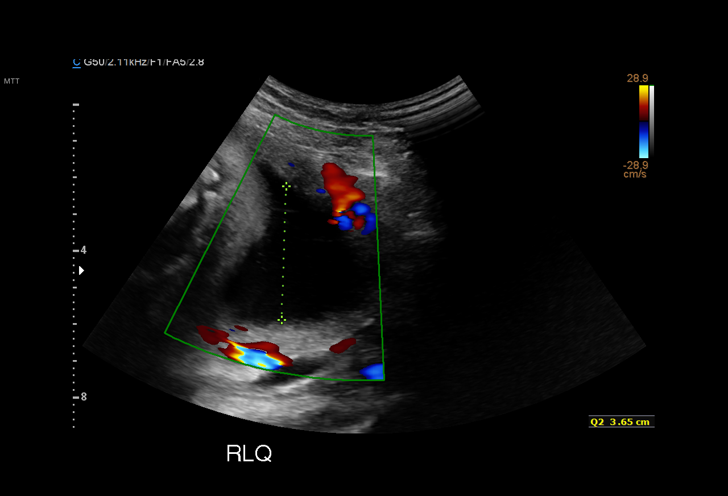
[im 68/84]
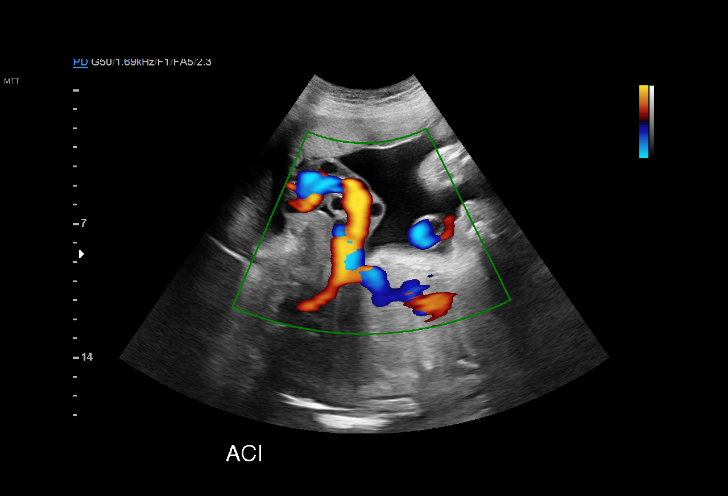
[im 74/84]
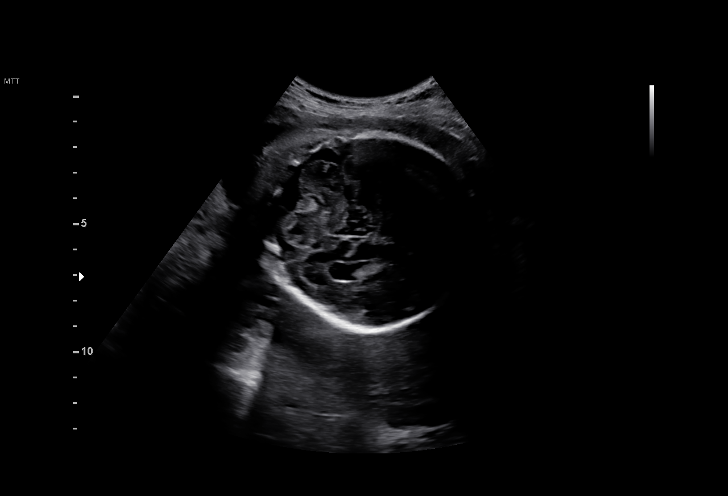
[im 80/84]
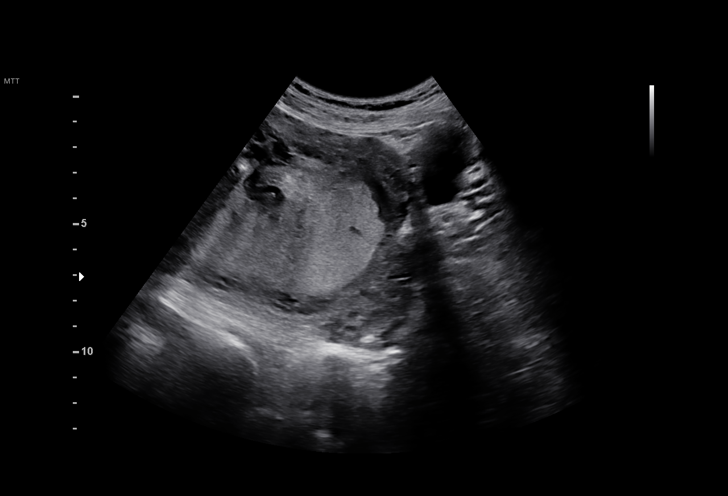

[12 of 28 positions shown; findings below may reference images not displayed]

OB

 ----------------------------------------------------------------------

 ----------------------------------------------------------------------
Indications

  Maternal care for known or suspected poor
  fetal growth, third trimester, not applicable or
  unspecified IUGR
  31 weeks gestation of pregnancy
  Low Risk NIPS ( Negative AFP)
  Low lying placenta, antepartum
  Echogenic intracardiac focus of the heart
  (EIF)
  Encounter for congenital cardiac
  abnormalities (enlarged pulmonary artery)
  Encounter for other antenatal screening
  follow-up
 ----------------------------------------------------------------------
Fetal Evaluation

 Num Of Fetuses:         1
 Fetal Heart Rate(bpm):  121
 Cardiac Activity:       Observed
 Placenta:               Posterior, low-lying
 P. Cord Insertion:      Visualized

 Amniotic Fluid
 AFI FV:      Within normal limits

 AFI Sum(cm)     %Tile       Largest Pocket(cm)
 13.24           41

 RUQ(cm)       RLQ(cm)       LUQ(cm)        LLQ(cm)

Biometry

 BPD:      75.9  mm     G. Age:  30w 3d          8  %    CI:        78.48   %    70 - 86
                                                         FL/HC:      20.6   %    19.1 -
 HC:       271   mm     G. Age:  29w 4d        < 1  %    HC/AC:      1.04        0.96 -
 AC:      259.9  mm     G. Age:  30w 1d          8  %    FL/BPD:     73.4   %    71 - 87
 FL:       55.7  mm     G. Age:  29w 2d          1  %    FL/AC:      21.4   %    20 - 24
 CER:      38.4  mm     G. Age:  32w 5d         64  %

 LV:        7.2  mm

 Est. FW:    6945  gm      3 lb 4 oz      3  %
OB History

 Gravidity:    4         Term:   2        Prem:   0        SAB:   1
 TOP:          0       Ectopic:  0        Living: 2
Gestational Age

 LMP:           31w 6d        Date:  07/05/18                 EDD:   04/11/19
 U/S Today:     29w 6d                                        EDD:   04/25/19
 Best:          31w 6d     Det. By:  LMP  (07/05/18)          EDD:   04/11/19
Anatomy

 Cranium:               Appears normal         LVOT:                   Previously seen
 Cavum:                 Appears normal         Aortic Arch:            Appears normal
 Ventricles:            Appears normal         Ductal Arch:            Not well visualized
 Choroid Plexus:        Previously seen        Diaphragm:              Appears normal
 Cerebellum:            Appears normal         Stomach:                Appears normal, left
                                                                       sided
 Posterior Fossa:       Previously seen        Abdomen:                Appears normal
 Nuchal Fold:           Not applicable (>20    Abdominal Wall:         Appears nml (cord
                        wks GA)                                        insert, abd wall)
 Face:                  Orbits and profile     Cord Vessels:           Previously seen
                        previously seen
 Lips:                  Previously seen        Kidneys:                Appear normal
 Palate:                Not well visualized    Bladder:                Appears normal
 Thoracic:              Previously seen        Spine:                  Previously seen
 Heart:                 Echogenic focus        Upper Extremities:      Previously seen
                        in LV
 RVOT:                  Abnormal, see          Lower Extremities:      Previously seen
                        comments
Doppler - Fetal Vessels

 Umbilical Artery
  S/D     %tile                                            ADFV    RDFV
 2.77       55                                                No      No

Cervix Uterus Adnexa

 Cervix
 Not visualized (advanced GA >17wks)

 Uterus
 No abnormality visualized.
 Left Ovary
 Within normal limits. No adnexal mass visualized.

 Right Ovary
 Within normal limits. No adnexal mass visualized.

 Cul De Sac
 No free fluid seen.

 Adnexa
 No abnormality visualized.
Comments

 This patient was seen for a follow up growth scan due to fetal
 growth restriction noted during her prior ultrasound exam.
 She denies any problems since her last exam and reports
 feeling vigorous fetal movements throughout the day.  An
 enlarged pulmonary artery was noted during her last
 ultrasound exam.  This finding was confirmed by pediatric
 cardiology at [HOSPITAL].  The patient reports that she has a follow-
 up appointment on [DATE] with pediatric cardiology.
 Recommendations will be made at that exam regarding the
 most optimal hospital for the patient to deliver at.
 On today's exam, the EFW measures at the 3rd percentile for
 her gestational age indicating fetal growth restriction.  The
 fetus has grown over half a pound over the past 3 weeks.
 There was normal amniotic fluid noted.
 Fetal movements were noted throughout today's ultrasound
 exam.
 A marginal placenta previa continues to be noted on today's
 exam.  The patient was advised that hopefully the placenta
 will move away from the internal cervical os later in her
 pregnancy.  We will perform a transvaginal ultrasound in a
 few weeks to determine the exact placental location.
 Doppler studies of the umbilical arteries showed a normal
 S/D ratio of 2.77.  There were no signs of absent or reversed
 end-diastolic flow.
 Due to fetal growth restriction, we will continue to follow her
 with weekly fetal testing and umbilical artery Doppler studies.
 A biophysical profile and umbilical artery Doppler study was
 scheduled in 1 week.  We will reassess the fetal growth again
 in 3 weeks.
